# Patient Record
Sex: Female | Born: 1935 | Hispanic: Yes | Marital: Single | Smoking: Never smoker
Health system: Southern US, Community
[De-identification: ages and names within clinical notes are randomized; demographics above are authoritative.]

## PROBLEM LIST (undated history)

## (undated) DIAGNOSIS — K8050 Calculus of bile duct without cholangitis or cholecystitis without obstruction: Secondary | ICD-10-CM

## (undated) DIAGNOSIS — N189 Chronic kidney disease, unspecified: Secondary | ICD-10-CM

## (undated) DIAGNOSIS — I10 Essential (primary) hypertension: Secondary | ICD-10-CM

## (undated) DIAGNOSIS — K8051 Calculus of bile duct without cholangitis or cholecystitis with obstruction: Secondary | ICD-10-CM

## (undated) DIAGNOSIS — K449 Diaphragmatic hernia without obstruction or gangrene: Secondary | ICD-10-CM

## (undated) HISTORY — PX: APPENDECTOMY: SHX54

## (undated) HISTORY — DX: Diaphragmatic hernia without obstruction or gangrene: K44.9

## (undated) HISTORY — PX: KIDNEY STONE SURGERY: SHX686

## (undated) HISTORY — DX: Calculus of bile duct without cholangitis or cholecystitis without obstruction: K80.50

## (undated) HISTORY — PX: CATARACT EXTRACTION: SUR2

---

## 2011-01-09 ENCOUNTER — Emergency Department (HOSPITAL_COMMUNITY): Payer: Self-pay

## 2011-01-09 ENCOUNTER — Emergency Department (HOSPITAL_COMMUNITY)
Admission: EM | Admit: 2011-01-09 | Discharge: 2011-01-09 | Disposition: A | Discharge status: Home / Self Care | Payer: Self-pay | Attending: Emergency Medicine | Admitting: Emergency Medicine

## 2011-01-09 ENCOUNTER — Encounter (HOSPITAL_COMMUNITY): Payer: Self-pay | Admitting: Radiology

## 2011-01-09 DIAGNOSIS — E119 Type 2 diabetes mellitus without complications: Secondary | ICD-10-CM | POA: Insufficient documentation

## 2011-01-09 DIAGNOSIS — M545 Low back pain, unspecified: Secondary | ICD-10-CM | POA: Insufficient documentation

## 2011-01-09 DIAGNOSIS — D179 Benign lipomatous neoplasm, unspecified: Secondary | ICD-10-CM | POA: Insufficient documentation

## 2011-01-09 DIAGNOSIS — I10 Essential (primary) hypertension: Secondary | ICD-10-CM | POA: Insufficient documentation

## 2011-01-09 HISTORY — DX: Essential (primary) hypertension: I10

## 2011-01-09 LAB — URINALYSIS, ROUTINE W REFLEX MICROSCOPIC
Bilirubin Urine: NEGATIVE
Ketones, ur: NEGATIVE mg/dL
Nitrite: NEGATIVE
pH: 6.5 (ref 5.0–8.0)

## 2011-01-09 LAB — CBC
HCT: 38.2 % (ref 36.0–46.0)
MCHC: 34 g/dL (ref 30.0–36.0)
MCV: 85.7 fL (ref 78.0–100.0)
RDW: 13.4 % (ref 11.5–15.5)
WBC: 9 10*3/uL (ref 4.0–10.5)

## 2011-01-09 LAB — BASIC METABOLIC PANEL
BUN: 24 mg/dL — ABNORMAL HIGH (ref 6–23)
Chloride: 94 mEq/L — ABNORMAL LOW (ref 96–112)
Creatinine, Ser: 1.27 mg/dL — ABNORMAL HIGH (ref 0.50–1.10)
GFR calc Af Amer: 49 mL/min — ABNORMAL LOW (ref >60)

## 2011-01-09 LAB — DIFFERENTIAL
Eosinophils Relative: 0 % (ref 0–5)
Lymphocytes Relative: 16 % (ref 12–46)
Lymphs Abs: 1.4 10*3/uL (ref 0.7–4.0)
Monocytes Absolute: 0.4 10*3/uL (ref 0.1–1.0)
Monocytes Relative: 4 % (ref 3–12)

## 2011-01-09 LAB — URINE MICROSCOPIC-ADD ON

## 2011-06-01 ENCOUNTER — Other Ambulatory Visit: Payer: Self-pay

## 2011-06-01 ENCOUNTER — Encounter (HOSPITAL_COMMUNITY): Payer: Self-pay | Admitting: *Deleted

## 2011-06-01 ENCOUNTER — Emergency Department (HOSPITAL_COMMUNITY): Payer: Self-pay

## 2011-06-01 ENCOUNTER — Inpatient Hospital Stay (HOSPITAL_COMMUNITY)
Admission: EM | Admit: 2011-06-01 | Discharge: 2011-06-02 | DRG: 195 | Disposition: A | Discharge status: Home / Self Care | Payer: Self-pay | Attending: Family Medicine | Admitting: Family Medicine

## 2011-06-01 DIAGNOSIS — E119 Type 2 diabetes mellitus without complications: Secondary | ICD-10-CM

## 2011-06-01 DIAGNOSIS — M25519 Pain in unspecified shoulder: Secondary | ICD-10-CM | POA: Diagnosis present

## 2011-06-01 DIAGNOSIS — J111 Influenza due to unidentified influenza virus with other respiratory manifestations: Secondary | ICD-10-CM

## 2011-06-01 DIAGNOSIS — Z87442 Personal history of urinary calculi: Secondary | ICD-10-CM

## 2011-06-01 DIAGNOSIS — I10 Essential (primary) hypertension: Secondary | ICD-10-CM | POA: Diagnosis present

## 2011-06-01 DIAGNOSIS — Z23 Encounter for immunization: Secondary | ICD-10-CM

## 2011-06-01 DIAGNOSIS — B9789 Other viral agents as the cause of diseases classified elsewhere: Secondary | ICD-10-CM

## 2011-06-01 DIAGNOSIS — R509 Fever, unspecified: Secondary | ICD-10-CM | POA: Diagnosis present

## 2011-06-01 DIAGNOSIS — E860 Dehydration: Secondary | ICD-10-CM | POA: Diagnosis present

## 2011-06-01 DIAGNOSIS — Z79899 Other long term (current) drug therapy: Secondary | ICD-10-CM

## 2011-06-01 DIAGNOSIS — J09X2 Influenza due to identified novel influenza A virus with other respiratory manifestations: Principal | ICD-10-CM | POA: Diagnosis present

## 2011-06-01 LAB — CBC
HCT: 38.4 % (ref 36.0–46.0)
Hemoglobin: 12.6 g/dL (ref 12.0–15.0)
MCHC: 32.8 g/dL (ref 30.0–36.0)
RBC: 4.47 MIL/uL (ref 3.87–5.11)
WBC: 8.2 10*3/uL (ref 4.0–10.5)

## 2011-06-01 LAB — BASIC METABOLIC PANEL
BUN: 21 mg/dL (ref 6–23)
CO2: 24 mEq/L (ref 19–32)
Chloride: 96 mEq/L (ref 96–112)
Glucose, Bld: 235 mg/dL — ABNORMAL HIGH (ref 70–99)
Potassium: 3.6 mEq/L (ref 3.5–5.1)

## 2011-06-01 LAB — DIFFERENTIAL
Basophils Relative: 0 % (ref 0–1)
Eosinophils Relative: 0 % (ref 0–5)
Lymphocytes Relative: 7 % — ABNORMAL LOW (ref 12–46)
Monocytes Relative: 5 % (ref 3–12)
Neutro Abs: 7.2 10*3/uL (ref 1.7–7.7)

## 2011-06-01 LAB — URINALYSIS, ROUTINE W REFLEX MICROSCOPIC
Bilirubin Urine: NEGATIVE
Hgb urine dipstick: NEGATIVE
Nitrite: NEGATIVE
Specific Gravity, Urine: 1.016 (ref 1.005–1.030)
pH: 7 (ref 5.0–8.0)

## 2011-06-01 LAB — URINE MICROSCOPIC-ADD ON

## 2011-06-01 MED ORDER — ALBUTEROL SULFATE (5 MG/ML) 0.5% IN NEBU
5.0000 mg | INHALATION_SOLUTION | RESPIRATORY_TRACT | Status: AC
  Administered 2011-06-01: 5 mg via RESPIRATORY_TRACT
  Filled 2011-06-01: qty 1

## 2011-06-01 MED ORDER — SODIUM CHLORIDE 0.9 % IV SOLN
Freq: Once | INTRAVENOUS | Status: AC
  Administered 2011-06-01: 21:00:00 via INTRAVENOUS

## 2011-06-01 MED ORDER — SODIUM CHLORIDE 0.9 % IV BOLUS (SEPSIS)
500.0000 mL | Freq: Once | INTRAVENOUS | Status: AC
  Administered 2011-06-01: 1000 mL via INTRAVENOUS

## 2011-06-01 MED ORDER — CLONIDINE HCL 0.1 MG PO TABS
0.1000 mg | ORAL_TABLET | Freq: Once | ORAL | Status: AC
  Administered 2011-06-01: 0.1 mg via ORAL
  Filled 2011-06-01: qty 1

## 2011-06-01 MED ORDER — SODIUM CHLORIDE 0.9 % IV SOLN: INTRAVENOUS | Status: DC

## 2011-06-01 NOTE — ED Notes (Signed)
Pt. Unable to provide urine sample at this time. 

## 2011-06-01 NOTE — ED Provider Notes (Signed)
  Physical Exam  BP 180/101  Pulse 89  Temp(Src) 101.5 F (38.6 C) (Oral)  Resp 15  SpO2 98%  Physical Exam  ED Course  Procedures  MDM Fever and bodyaches. Patient does not speak [place]. She'll be moved to a regular exam room for more extensive workup.      Juliet Rude. Rubin Payor, MD 06/01/11 1712

## 2011-06-01 NOTE — ED Notes (Signed)
Pharmacy notified of need for med rec.  

## 2011-06-01 NOTE — ED Provider Notes (Signed)
History     CSN: [id number] Arrival date & time: 06/01/2011  2:51 PM   First MD Initiated Contact with Patient 06/01/11 1647      Chief Complaint  Patient presents with  . Shoulder Pain  . Generalized Body Aches  . Fever    (Consider location/radiation/quality/duration/timing/severity/associated sxs/prior treatment) HPI Comments: Patient reports she has had fever, sore throat, cough productive of white sputum and SOB x 5 days.  Has had associated decreased appetite.  Has been taking tylenol and motrin without improvement.    Patient is a 77 y.o. female presenting with fever. The history is provided by the patient and a relative. No language interpreter was used.  Fever Primary symptoms of the febrile illness include fever, cough and shortness of breath. Primary symptoms do not include abdominal pain, nausea, vomiting or diarrhea. The current episode started 3 to 5 days ago.  The cough began 3 to 5 days ago. The cough is productive. The sputum is white. Primary symptoms comment: +sore throat, rhinorrhea    Past Medical History  Diagnosis Date  . Hypertension   . Diabetes mellitus     Past Surgical History  Procedure Date  . Kidney stone surgery     No family history on file.  History  Substance Use Topics  . Smoking status: Never Smoker   . Smokeless tobacco: Not on file  . Alcohol Use: No    OB History    Grav Para Term Preterm Abortions TAB SAB Ect Mult Living                  Review of Systems  Constitutional: Positive for fever.  Respiratory: Positive for cough and shortness of breath.   Gastrointestinal: Positive for constipation. Negative for nausea, vomiting, abdominal pain and diarrhea.  All other systems reviewed and are negative.    Allergies  Review of patient's allergies indicates not on file.  Home Medications  No current outpatient prescriptions on file.  BP 217/90  Pulse 84  Temp(Src) 99.5 F (37.5 C) (Oral)  Resp 16  SpO2  100%  Physical Exam  Nursing note and vitals reviewed. Constitutional: She is oriented to person, place, and time. She appears well-developed and well-nourished.  HENT:  Head: Normocephalic and atraumatic.  Mouth/Throat: Uvula is midline. Mucous membranes are dry. No oropharyngeal exudate, posterior oropharyngeal edema or posterior oropharyngeal erythema.  Neck: Neck supple.  Cardiovascular: Normal rate, regular rhythm and normal heart sounds.   Pulmonary/Chest: Breath sounds normal. No respiratory distress. She has no wheezes. She has no rales. She exhibits no tenderness.       Shallow breathing + cough  Abdominal: Soft. Bowel sounds are normal. There is no tenderness.  Neurological: She is alert and oriented to person, place, and time.    ED Course  Procedures (including critical care time)  Labs Reviewed  DIFFERENTIAL - Abnormal; Notable for the following:    Neutrophils Relative 88 (*)    Lymphocytes Relative 7 (*)    Lymphs Abs 0.6 (*)    All other components within normal limits  BASIC METABOLIC PANEL - Abnormal; Notable for the following:    Sodium 134 (*)    Glucose, Bld 235 (*)    Creatinine, Ser 1.34 (*)    GFR calc non Af Amer 35 (*)    GFR calc Af Amer 41 (*)    All other components within normal limits  URINALYSIS, ROUTINE W REFLEX MICROSCOPIC - Abnormal; Notable for the following:  Glucose, UA 250 (*)    Ketones, ur 15 (*)    Protein, ur 30 (*)    Leukocytes, UA SMALL (*)    All other components within normal limits  CBC  URINE MICROSCOPIC-ADD ON  URINE CULTURE  POCT RAPID INFLUENZA A&B   Dg Chest 2 View  06/01/2011  *RADIOLOGY REPORT*  Clinical Data: 1-week history of cough and wheezing.  CHEST - 2 VIEW 06/01/2011:  Comparison: Two-view chest x-ray earlier today 1555 hours [facility].  Findings: Interval development of mild atelectasis in the left lower lobe, related to a less than optimal inspiration currently. Lungs otherwise clear.  Cardiac  silhouette enlarged but stable. Thoracic aorta tortuous atherosclerotic.  Hilar and mediastinal contours otherwise unremarkable.  No pleural effusions. Generalized osteopenia.  IMPRESSION: Suboptimal inspiration accounts for mild left lower lobe atelectasis.  No acute cardiopulmonary disease otherwise.  Original Report Authenticated By: Arnell Sieving, M.D.   Dg Chest 2 View  06/01/2011  *RADIOLOGY REPORT*  Clinical Data: Cough.  CHEST - 2 VIEW  Comparison: None.  Findings: Lungs are clear.  Heart size is normal.  No pneumothorax or pleural effusion.  IMPRESSION: No acute disease.  Original Report Authenticated By: Bernadene Bell. D'ALESSIO, M.D.   8:31 PM Patient seen and examined, discussed with Dr Weldon Inches.   11:27 PM Per family member, patient has been talking to her dead husband and talking to herself in the room, talking to various people who are not present.     Date: 06/02/2011  Rate: 81  Rhythm: normal sinus rhythm  QRS Axis: left  Intervals: normal  ST/T Wave abnormalities: t wave inversions  Conduction Disutrbances:none  Narrative Interpretation:   Old EKG Reviewed: none available    1. Dehydration   2. Viral respiratory illness       MDM  Frail elderly patient with viral respiratory illness with fever, decreased appetite and PO intake, hallucinating.  HTN improved with clonidine.  O2 saturation 94-96% on room air, not improved with nebulizer treatment.  PCP is [facility].  Admitted to [facility]          Rise Patience, PA 06/02/11 0024  Rise Patience, [place] 06/02/11 [phone]

## 2011-06-01 NOTE — ED Notes (Signed)
Patient reported to have body aches and fever for 5 days.

## 2011-06-01 NOTE — ED Notes (Signed)
Alert, interactive, calm, skin W&D, resps e/u, NAD, sitting in w/c, family at [facility], IVF infusing, moved to pod A 5.

## 2011-06-01 NOTE — ED Notes (Signed)
Pt information obtained via phone translator - pt reported to have productive cough, fever, lethargy, and "flu-like" symptoms. Pt noted to seem "dilerious" this a.m. - pt currently hypertensive w/ BP of 206/84 - pt's daughter states pt took her antihypertensive medication this a.m. Pt admits to chest pain - worse w/ cough. Denies n/v/d. Pt confused to current location and time - alert to person. EKG ordered per protocol.

## 2011-06-02 ENCOUNTER — Encounter (HOSPITAL_COMMUNITY): Payer: Self-pay | Admitting: Orthopedic Surgery

## 2011-06-02 LAB — CBC
HCT: 33.2 % — ABNORMAL LOW (ref 36.0–46.0)
MCH: 28.4 pg (ref 26.0–34.0)
MCH: 27.9 pg (ref 26.0–34.0)
MCHC: 32.6 g/dL (ref 30.0–36.0)
MCV: 85.6 fL (ref 78.0–100.0)
MCV: 85.5 fL (ref 78.0–100.0)
Platelets: 189 10*3/uL (ref 150–400)
Platelets: 183 10*3/uL (ref 150–400)
RBC: 3.88 MIL/uL (ref 3.87–5.11)
RDW: 14 % (ref 11.5–15.5)
WBC: 6.4 10*3/uL (ref 4.0–10.5)

## 2011-06-02 LAB — INFLUENZA PANEL BY PCR (TYPE A & B): Influenza A By PCR: POSITIVE — AB

## 2011-06-02 LAB — GLUCOSE, CAPILLARY: Glucose-Capillary: 158 mg/dL — ABNORMAL HIGH (ref 70–99)

## 2011-06-02 LAB — BASIC METABOLIC PANEL
BUN: 18 mg/dL (ref 6–23)
CO2: 24 mEq/L (ref 19–32)
Calcium: 8.6 mg/dL (ref 8.4–10.5)
Creatinine, Ser: 1.19 mg/dL — ABNORMAL HIGH (ref 0.50–1.10)
Glucose, Bld: 287 mg/dL — ABNORMAL HIGH (ref 70–99)

## 2011-06-02 MED ORDER — ALBUTEROL SULFATE (5 MG/ML) 0.5% IN NEBU: 2.5000 mg | INHALATION_SOLUTION | Freq: Four times a day (QID) | RESPIRATORY_TRACT | Status: DC | PRN

## 2011-06-02 MED ORDER — ACETAMINOPHEN 325 MG PO TABS
650.0000 mg | ORAL_TABLET | Freq: Once | ORAL | Status: AC
  Administered 2011-06-02: 650 mg via ORAL
  Filled 2011-06-02: qty 2

## 2011-06-02 MED ORDER — GLIMEPIRIDE 4 MG PO TABS
4.0000 mg | ORAL_TABLET | Freq: Every day | ORAL | Status: DC
Start: 2011-06-02 — End: 2011-06-02
  Administered 2011-06-02: 4 mg via ORAL
  Filled 2011-06-02 (×2): qty 1

## 2011-06-02 MED ORDER — SODIUM CHLORIDE 0.9 % IV SOLN
INTRAVENOUS | Status: DC
  Administered 2011-06-02: 06:00:00 via INTRAVENOUS

## 2011-06-02 MED ORDER — HYDROCHLOROTHIAZIDE 25 MG PO TABS
25.0000 mg | ORAL_TABLET | Freq: Every day | ORAL | Status: DC
  Administered 2011-06-02: 25 mg via ORAL
  Filled 2011-06-02: qty 1

## 2011-06-02 MED ORDER — METFORMIN HCL 500 MG PO TABS
1000.0000 mg | ORAL_TABLET | Freq: Two times a day (BID) | ORAL | Status: DC
  Administered 2011-06-02: 1000 mg via ORAL
  Filled 2011-06-02 (×3): qty 2

## 2011-06-02 MED ORDER — INSULIN ASPART 100 UNIT/ML ~~LOC~~ SOLN
0.0000 [IU] | Freq: Three times a day (TID) | SUBCUTANEOUS | Status: DC
  Administered 2011-06-02: 5 [IU] via SUBCUTANEOUS
  Administered 2011-06-02: 2 [IU] via SUBCUTANEOUS
  Filled 2011-06-02: qty 3

## 2011-06-02 MED ORDER — OSELTAMIVIR PHOSPHATE 75 MG PO CAPS: 75.0000 mg | ORAL_CAPSULE | Freq: Two times a day (BID) | ORAL | Status: AC

## 2011-06-02 MED ORDER — LISINOPRIL-HYDROCHLOROTHIAZIDE 20-25 MG PO TABS: 1.0000 | ORAL_TABLET | Freq: Every day | ORAL | Status: DC

## 2011-06-02 MED ORDER — ACETAMINOPHEN 325 MG PO TABS: 650.0000 mg | ORAL_TABLET | Freq: Four times a day (QID) | ORAL | Status: DC | PRN

## 2011-06-02 MED ORDER — LISINOPRIL 20 MG PO TABS
20.0000 mg | ORAL_TABLET | Freq: Every day | ORAL | Status: DC
  Administered 2011-06-02: 20 mg via ORAL
  Filled 2011-06-02: qty 1

## 2011-06-02 MED ORDER — DOCUSATE SODIUM 100 MG PO CAPS
100.0000 mg | ORAL_CAPSULE | Freq: Two times a day (BID) | ORAL | Status: DC
  Administered 2011-06-02: 100 mg via ORAL
  Filled 2011-06-02 (×2): qty 1

## 2011-06-02 MED ORDER — HEPARIN SODIUM (PORCINE) 5000 UNIT/ML IJ SOLN
5000.0000 [IU] | Freq: Three times a day (TID) | INTRAMUSCULAR | Status: DC
  Administered 2011-06-02: 5000 [IU] via SUBCUTANEOUS
  Filled 2011-06-02 (×4): qty 1

## 2011-06-02 MED ORDER — PNEUMOCOCCAL VAC POLYVALENT 25 MCG/0.5ML IJ INJ: 0.5000 mL | INJECTION | INTRAMUSCULAR | Status: DC

## 2011-06-02 MED ORDER — OSELTAMIVIR PHOSPHATE 75 MG PO CAPS
75.0000 mg | ORAL_CAPSULE | Freq: Two times a day (BID) | ORAL | Status: DC
  Administered 2011-06-02: 75 mg via ORAL
  Filled 2011-06-02 (×3): qty 1

## 2011-06-02 MED ORDER — ACETAMINOPHEN 650 MG RE SUPP: 650.0000 mg | Freq: Four times a day (QID) | RECTAL | Status: DC | PRN

## 2011-06-02 NOTE — H&P (Signed)
Family Medicine Teaching Service Attending Note  I interviewed and examined patient Tammy Willis and reviewed their tests and x-rays.  I discussed with Dr. Hulen Luster and reviewed their note for today.  I agree with their assessment and plan.     Additionally  This am is resting easily.  No pain or shortness of breath Did not eat much breakfast - apparently did not like it Lungs - basilar crackles no respiratory distress No edema Seems to be improving.  Discuss with family, assess ambulation and ability to do ADLs and take oral to determine if ready for discharge.  Would complete tamiflu course if family can afford.   Likely of low benefit but also low risk

## 2011-06-02 NOTE — ED Provider Notes (Signed)
Medical screening examination/treatment/procedure(s) were performed by non-physician practitioner and as supervising physician I was immediately available for consultation/collaboration.  Sakai Wolford P Rayne Loiseau, MD 06/02/11 1510 

## 2011-06-02 NOTE — H&P (Signed)
Family Medicine Teaching [facility] Admission History and Physical  Patient name: Tammy Willis Medical record number: [id number] Date of birth: [birth year] Age: 77 y.o. Gender: female  Primary Care Provider: [facility] Chief Complaint: Cough and fever  History of Present Illness: Tammy Willis is a 77 y.o. year old female presenting with 5 days of cough and fever. She has multiple sick contacts with flulike illness. Since Monday she has been coughing and has been running a tactile temperature. No one has checked her temperature of thermometer. She lives with her daughter who is taking care of her. Another daughter is here today to help with the history. History is obtained with a translator phone throughout the the entire interview. Daughter states that she's been doing fine at home with her flulike symptoms until today. Today she felt she was getting increasingly feverish, had increasing cough, and was complaining of chest pain when she coughed. Daughter also states that she was talking to people who weren't in the room. Yesterday and today she has not been eating well and she is not drinking hardly at all.  Patient is an immigrant  from [place]. She does not have Medicaid or financial aide set up. Her daughter spoke with me for a long time about her financial concerns. They're concerned because they have an old bill from [employer] that they are getting calls about from people who do not speak Spanish.  Review Of Systems: Per HPI with the following additions: Patient denies any pain at this time. Otherwise 12 point review of systems was performed and was unremarkable.  There is no problem list on file for this patient.   Past Medical History: Past Medical History  Diagnosis Date  . Hypertension   . Diabetes mellitus     Past Surgical History: Past Surgical History  Procedure Date  . Kidney stone surgery     Social History: History   Social History    . Marital Status: Single    Spouse Name: N/A    Number of Children: N/A  . Years of Education: N/A   Social History Main Topics  . Smoking status: Never Smoker   . Smokeless tobacco: None  . Alcohol Use: No  . Drug Use: No  . Sexually Active:    Other Topics Concern  . None   Social History Narrative  . None    Family History: All of her children have diabetes. Allergies: No Known Allergies  Current Facility-Administered Medications  Medication Dose Route Frequency Provider Last Rate Last Dose  . 0.9 %  sodium chloride infusion   Intravenous Once Rise Patience, [place] 150 mL/hr at 06/01/11 2036    . 0.9 %  sodium chloride infusion   Intravenous Continuous Ellery Plunk      . acetaminophen (TYLENOL) tablet 650 mg  650 mg Oral Q6H PRN Ellery Plunk       Or  . acetaminophen (TYLENOL) suppository 650 mg  650 mg Rectal Q6H PRN Ellery Plunk      . acetaminophen (TYLENOL) tablet 650 mg  650 mg Oral Once Rise Patience, PA   650 mg at 06/02/11 0047  . albuterol (PROVENTIL) (5 MG/ML) 0.5% nebulizer solution 5 mg  5 mg Nebulization NOW Rise Patience, PA   5 mg at 06/01/11 2118  . cloNIDine (CATAPRES) tablet 0.1 mg  0.1 mg Oral Once Rise Patience, PA   0.1 mg at 06/01/11 2116  . docusate sodium (COLACE) capsule 100 mg  100  mg Oral BID Ellery Plunk      . glimepiride (AMARYL) tablet 4 mg  4 mg Oral QAC breakfast Ellery Plunk      . heparin injection 5,000 Units  5,000 Units Subcutaneous Q8H Ellery Plunk      . insulin aspart (novoLOG) injection 0-9 Units  0-9 Units Subcutaneous TID WC Ellery Plunk      . lisinopril-hydrochlorothiazide (PRINZIDE,ZESTORETIC) 20-25 MG per tablet 1 tablet  1 tablet Oral Daily Ellery Plunk      . metFORMIN (GLUCOPHAGE) tablet 1,000 mg  1,000 mg Oral BID WC Ellery Plunk      . sodium chloride 0.9 % bolus 500 mL  500 mL Intravenous Once Harrold Donath R. Pickering, MD   1,000 mL at 06/01/11 1827  . DISCONTD: 0.9 %  sodium chloride infusion   Intravenous  Continuous Juliet Rude. Pickering, MD   1,000 mL at 06/01/11 1828   Current Outpatient Prescriptions  Medication Sig Dispense Refill  . glimepiride (AMARYL) 4 MG tablet Take 4 mg by mouth daily before breakfast.        . lisinopril-hydrochlorothiazide (PRINZIDE,ZESTORETIC) 20-25 MG per tablet Take 1 tablet by mouth daily.        . metFORMIN (GLUCOPHAGE) 1000 MG tablet Take 1,000 mg by mouth 2 (two) times daily with a meal.        . multivitamin-iron-minerals-folic acid (CENTRUM) chewable tablet Chew 1 tablet by mouth daily.           Physical Exam: BP 137/59  Pulse 82  Temp(Src) 101.4 F (38.6 C) (Oral)  Resp 26  SpO2 95%            General: cooperative, fatigued and no distress HEENT: PERRLA, extra ocular movement intact and trachea midline Heart: S1, S2 normal, no murmur, rub or gallop, regular rate and rhythm Lungs: no wheezes or rales and unlabored breathing Abdomen: abdomen is soft without significant tenderness, masses, organomegaly or guarding Extremities: extremities normal, atraumatic, no cyanosis or edema Musculoskeletal: not examined, patient did not want to be examined because it was late and she wanted to go back to sleep. Skin:no rashes, thin and dry Neurology: Patient was easily awoken for exam. However it was late and she did not want to be examined or participate in the exam. Per her daughter her speech is normal. She was moving all 4 extremities. Her cranial nerves were grossly intact.  Labs and Imaging: Lab Results  Component Value Date/Time   NA 134* 06/01/2011  5:14 PM   K 3.6 06/01/2011  5:14 PM   CL 96 06/01/2011  5:14 PM   CO2 24 06/01/2011  5:14 PM   BUN 21 06/01/2011  5:14 PM   CREATININE 1.34* 06/01/2011  5:14 PM   GLUCOSE 235* 06/01/2011  5:14 PM   Lab Results  Component Value Date   WBC 8.2 06/01/2011   HGB 12.6 06/01/2011   HCT 38.4 06/01/2011   MCV 85.9 06/01/2011   PLT 221 06/01/2011   Dg Chest 2 View  06/01/2011  *RADIOLOGY REPORT*  Clinical Data:  1-week history of cough and wheezing.  CHEST - 2 VIEW 06/01/2011:  Comparison: Two-view chest x-ray earlier today 1555 hours [facility].  Findings: Interval development of mild atelectasis in the left lower lobe, related to a less than optimal inspiration currently. Lungs otherwise clear.  Cardiac silhouette enlarged but stable. Thoracic aorta tortuous atherosclerotic.  Hilar and mediastinal contours otherwise unremarkable.  No pleural effusions. Generalized osteopenia.  IMPRESSION: Suboptimal inspiration accounts for  mild left lower lobe atelectasis.  No acute cardiopulmonary disease otherwise.  Original Report Authenticated By: Arnell Sieving, M.D.   Dg Chest 2 View  06/01/2011  *RADIOLOGY REPORT*  Clinical Data: Cough.  CHEST - 2 VIEW  Comparison: None.  Findings: Lungs are clear.  Heart size is normal.  No pneumothorax or pleural effusion.  IMPRESSION: No acute disease.  Original Report Authenticated By: Bernadene Bell. Maricela Curet, M.D.        Assessment and Plan: Noemy Hallmon is a 77 y.o. year old female presenting with flulike illness.  1. flulike illness-patient has a flulike illness. She's been sick for 5 days but her course is worsening instead of improving. Because she is in a high-risk group due to her age and because her illness is worsening, I will start Tamiflu in her. I think that her brief hallucinations were likely due to fever, and they are not present now. I plan to admit her and observe her overnight. Her chest x-ray is reassuring that she does not have a pneumonia. Her vitals are stable, and she seems to be maintaining her saturation. She does not have pain anywhere else or any other rashes or lesions to indicate another source of fever. Her urine does not strongly suggest infection either. Since she has not been drinking well, we will hydrate her with fluids. 2. Diabetes-we will restart her home medications. Her sugars seem to be a little bit high. I will start her  on a sliding scale sensitive to cover her while she is in the hospital. 3. Hypertension-we will restart patient's home medications. She is likely not have taken them over last several days because she has been sick. 4. Financial concerns-patient's daughter was very concerned about finances. I will place a care manager consult to have them discuss possibilities. 5. CODE STATUS-discussed patient's CODE STATUS with daughter. Use translator phone for the entire discussion. Daughter would like everything possible done. 6. FEN GI-will start 100 mL per hour of normal saline. Will place on carb modified diet. Patient with complaint of constipation, so will start Colace. 7. Prophylaxis: Heparin 5000 subcutaneous 3 times a day 8. Disposition: Likely short stay, pending improvement in condition.   SignedEllery Plunk, MD Family Medicine Resident PGY-3 06/02/2011 12:50 AM  [phone]

## 2011-06-03 LAB — URINE CULTURE: Culture  Setup Time: 201212072202

## 2011-06-04 NOTE — Discharge Summary (Signed)
Physician Discharge Summary  Patient ID: Tammy Willis MRN  [id number] DOB  December 31, 1924    Age: 77 y.o.  Admit date: 06/01/2011 Discharge date: 06/04/2011  PCP: No primary provider on file.   Discharge Diagnosis  1. Influenza A 2. DM II 3. HTN   Discharge Medications  Tammy Willis, Tammy Willis  Home Medication Instructions WUJ:[id number]   Printed on:06/04/11 1833  Medication Information                    metFORMIN (GLUCOPHAGE) 1000 MG tablet Take 1,000 mg by mouth 2 (two) times daily with a meal.             glimepiride (AMARYL) 4 MG tablet Take 4 mg by mouth daily before breakfast.             lisinopril-hydrochlorothiazide (PRINZIDE,ZESTORETIC) 20-25 MG per tablet Take 1 tablet by mouth daily.             Multiple Vitamins-Minerals (MULTIVITAMINS THER. W/MINERALS) TABS Take 1 tablet by mouth daily. Centrum silver            oseltamivir (TAMIFLU) 75 MG capsule Take 1 capsule (75 mg total) by mouth every 12 (twelve) hours.              Consults: None.  Labs: CBC  Lab 06/02/11 0500 06/02/11 0053 06/01/11 1714  WBC 6.4 7.5 8.2  HGB 10.4* 11.0* 12.6  HCT 31.9* 33.2* 38.4  PLT 183 189 221   BMET  Lab 06/02/11 0500 06/01/11 1714  NA 135 134*  K 3.5 3.6  CL 101 96  CO2 24 24  BUN 18 21  CREATININE 1.19* 1.34*  CALCIUM 8.6 9.5  PROT -- --  BILITOT -- --  ALKPHOS -- --  ALT -- --  AST -- --  GLUCOSE 287* 235*   Results for orders placed during the hospital encounter of 06/01/11 (from the past 72 hour(s))  URINALYSIS, ROUTINE W REFLEX MICROSCOPIC     Status: Abnormal   Collection Time   06/01/11  9:17 PM      Component Value Range Comment   Color, Urine YELLOW  YELLOW     APPearance CLEAR  CLEAR     Specific Gravity, Urine 1.016  1.005 - 1.030     pH 7.0  5.0 - 8.0     Glucose, UA 250 (*) NEGATIVE (mg/dL)    Hgb urine dipstick NEGATIVE  NEGATIVE     Bilirubin Urine NEGATIVE  NEGATIVE     Ketones, ur 15 (*) NEGATIVE (mg/dL)    Protein, ur  30 (*) NEGATIVE (mg/dL)    Urobilinogen, UA 1.0  0.0 - 1.0 (mg/dL)    Nitrite NEGATIVE  NEGATIVE     Leukocytes, UA SMALL (*) NEGATIVE    URINE CULTURE     Status: Normal   Collection Time   06/01/11  9:17 PM      Component Value Range Comment   Specimen Description URINE, CLEAN CATCH      Special Requests NONE      Setup Time [id number]      Colony Count 7,000 COLONIES/ML      Culture INSIGNIFICANT GROWTH      Report Status 06/03/2011 FINAL     URINE MICROSCOPIC-ADD ON     Status: Normal   Collection Time   06/01/11  9:17 PM      Component Value Range Comment   Squamous Epithelial / LPF RARE  RARE     WBC, UA 3-6  <  3 (WBC/hpf)    RBC / HPF 0-2  <3 (RBC/hpf)    Bacteria, UA RARE  RARE    INFLUENZA PANEL BY PCR     Status: Abnormal   Collection Time   06/02/11 12:43 AM      Component Value Range Comment   Influenza A By PCR POSITIVE (*) NEGATIVE     Influenza B By PCR NEGATIVE  NEGATIVE     H1N1 flu by pcr NOT DETECTED  NOT DETECTED      Procedures/Imaging:  Dg Chest 2 View 06/01/2011  IMPRESSION: Suboptimal inspiration accounts for mild left lower lobe atelectasis.  No acute cardiopulmonary disease otherwise.  Original Report Authenticated By: Arnell Sieving, M.D.   Dg Chest 2 View 06/01/2011 IMPRESSION: No acute disease.  Original Report Authenticated By: Bernadene Bell. D'ALESSIO, M.D.     Brief Hospital Course: Tammy Willis is a 77 y.o. year old female presenting with flulike illness.  1. Influenza A.  High-risk group due to her age and  worsening condition was the reason for starting her on Tamiflu. Had an episode of brief hallucinations were likely due to fever, and they did not repeat. Chest x-ray negative for pneumonia. Her vitals were stable during hospitalization. Was not having good intake for 5 days prior this admission so Hydration with NS was provided and pt responded well. 2. Diabetes: On home medication during hospitalization. Stable glucose on the mid  200's. No hypoglycemic episodes. 3. Hypertension:  Mostly systolic hypertension. We were not to aggressive with her BP since Pt did not have symptoms of end organ damage.We restarted her home meds since pt had 5 days without taking her treatment.   Patient condition at time of discharge/disposition:  Patient is discharge home on stable medical condition.   Follow up issues: 1. Respiratory condition. 2. DM. Glucose mid 200's consider A1C, and adjusting treatment if necessary.  3. HTN.   Discharge follow up:   Discharge Orders    Future Orders Please Complete By Expires   Diet Carb Modified      Increase activity slowly         D. [place] Rolene Arbour, MD  [facility] 06/04/2011

## 2011-06-05 NOTE — Discharge Summary (Signed)
I have reviewed this discharge summary and agree.    

## 2011-08-25 ENCOUNTER — Other Ambulatory Visit: Payer: Self-pay

## 2011-08-25 ENCOUNTER — Encounter (HOSPITAL_COMMUNITY): Payer: Self-pay | Admitting: Emergency Medicine

## 2011-08-25 ENCOUNTER — Emergency Department (HOSPITAL_COMMUNITY)
Admission: EM | Admit: 2011-08-25 | Discharge: 2011-08-26 | Disposition: A | Discharge status: Home / Self Care | Payer: Self-pay | Attending: Emergency Medicine | Admitting: Emergency Medicine

## 2011-08-25 ENCOUNTER — Ambulatory Visit (INDEPENDENT_AMBULATORY_CARE_PROVIDER_SITE_OTHER): Payer: Self-pay | Admitting: Family Medicine

## 2011-08-25 DIAGNOSIS — R55 Syncope and collapse: Secondary | ICD-10-CM

## 2011-08-25 DIAGNOSIS — R112 Nausea with vomiting, unspecified: Secondary | ICD-10-CM | POA: Insufficient documentation

## 2011-08-25 DIAGNOSIS — E1165 Type 2 diabetes mellitus with hyperglycemia: Secondary | ICD-10-CM | POA: Insufficient documentation

## 2011-08-25 DIAGNOSIS — R9431 Abnormal electrocardiogram [ECG] [EKG]: Secondary | ICD-10-CM

## 2011-08-25 DIAGNOSIS — I10 Essential (primary) hypertension: Secondary | ICD-10-CM | POA: Insufficient documentation

## 2011-08-25 DIAGNOSIS — E119 Type 2 diabetes mellitus without complications: Secondary | ICD-10-CM | POA: Insufficient documentation

## 2011-08-25 DIAGNOSIS — Z79899 Other long term (current) drug therapy: Secondary | ICD-10-CM | POA: Insufficient documentation

## 2011-08-25 DIAGNOSIS — R5381 Other malaise: Secondary | ICD-10-CM | POA: Insufficient documentation

## 2011-08-25 DIAGNOSIS — R1110 Vomiting, unspecified: Secondary | ICD-10-CM

## 2011-08-25 LAB — CBC
HCT: 41 % (ref 36.0–46.0)
Hemoglobin: 13.8 g/dL (ref 12.0–15.0)
MCH: 28.6 pg (ref 26.0–34.0)
MCHC: 33.7 g/dL (ref 30.0–36.0)
MCV: 84.9 fL (ref 78.0–100.0)
Platelets: 214 10*3/uL (ref 150–400)
RBC: 4.83 MIL/uL (ref 3.87–5.11)
RDW: 14.3 % (ref 11.5–15.5)
WBC: 10.3 10*3/uL (ref 4.0–10.5)

## 2011-08-25 LAB — URINALYSIS, ROUTINE W REFLEX MICROSCOPIC
Glucose, UA: 100 mg/dL — AB
Hgb urine dipstick: NEGATIVE
Ketones, ur: 15 mg/dL — AB
Nitrite: NEGATIVE
Protein, ur: 100 mg/dL — AB
Specific Gravity, Urine: 1.019 (ref 1.005–1.030)
Urobilinogen, UA: 1 mg/dL (ref 0.0–1.0)
pH: 6.5 (ref 5.0–8.0)

## 2011-08-25 LAB — URINE MICROSCOPIC-ADD ON

## 2011-08-25 LAB — COMPREHENSIVE METABOLIC PANEL
ALT: 6 U/L (ref 0–35)
AST: 16 U/L (ref 0–37)
Albumin: 3.6 g/dL (ref 3.5–5.2)
Alkaline Phosphatase: 106 U/L (ref 39–117)
BUN: 20 mg/dL (ref 6–23)
CO2: 25 mEq/L (ref 19–32)
Calcium: 10.2 mg/dL (ref 8.4–10.5)
Chloride: 102 mEq/L (ref 96–112)
Creatinine, Ser: 1.06 mg/dL (ref 0.50–1.10)
GFR calc Af Amer: 54 mL/min — ABNORMAL LOW (ref >90)
GFR calc non Af Amer: 47 mL/min — ABNORMAL LOW (ref >90)
Glucose, Bld: 207 mg/dL — ABNORMAL HIGH (ref 70–99)
Potassium: 3.5 mEq/L (ref 3.5–5.1)
Sodium: 139 mEq/L (ref 135–145)
Total Bilirubin: 0.6 mg/dL (ref 0.3–1.2)
Total Protein: 7.6 g/dL (ref 6.0–8.3)

## 2011-08-25 LAB — DIFFERENTIAL
Basophils Absolute: 0 10*3/uL (ref 0.0–0.1)
Basophils Relative: 0 % (ref 0–1)
Eosinophils Absolute: 0 10*3/uL (ref 0.0–0.7)
Eosinophils Relative: 0 % (ref 0–5)
Lymphocytes Relative: 4 % — ABNORMAL LOW (ref 12–46)
Lymphs Abs: 0.5 10*3/uL — ABNORMAL LOW (ref 0.7–4.0)
Monocytes Absolute: 0.3 10*3/uL (ref 0.1–1.0)
Monocytes Relative: 2 % — ABNORMAL LOW (ref 3–12)
Neutro Abs: 9.6 10*3/uL — ABNORMAL HIGH (ref 1.7–7.7)
Neutrophils Relative %: 93 % — ABNORMAL HIGH (ref 43–77)

## 2011-08-25 LAB — TROPONIN I: Troponin I: <0.3 ng/mL (ref <0.30)

## 2011-08-25 MED ORDER — ONDANSETRON HCL 4 MG/2ML IJ SOLN
4.0000 mg | Freq: Once | INTRAMUSCULAR | Status: AC
  Administered 2011-08-25: 4 mg via INTRAVENOUS
  Filled 2011-08-25: qty 2

## 2011-08-25 NOTE — ED Provider Notes (Signed)
History     CSN: [id number]  Arrival date & time 08/25/11  1752   First MD Initiated Contact with Patient 08/25/11 1910      Chief Complaint  Patient presents with  . Nausea  . Emesis  . Weakness    (Consider location/radiation/quality/duration/timing/severity/associated sxs/prior treatment) The history is provided by the patient. The history is limited by a language barrier. A language interpreter was used.    patient is emergency department following the onset of nausea and vomiting at around 11:30 this morning.  The patient states that she does not have any abdominal pain, chest pain, shortness of breath, weakness, headache, visual changes, fever, or diarrhea.  Patient also denies any blood in the vomit or stool.  Patient originally went to urgent care for treatment and they sent her to the emergency department for further evaluation. Past Medical History  Diagnosis Date  . Hypertension   . Diabetes mellitus     Past Surgical History  Procedure Date  . Kidney stone surgery     No family history on file.  History  Substance Use Topics  . Smoking status: Never Smoker   . Smokeless tobacco: Not on file  . Alcohol Use: No    OB History    Grav Para Term Preterm Abortions TAB SAB Ect Mult Living                  Review of Systems All pertinent positives and negatives reviewed in the history of present illness\  Allergies  Review of patient's allergies indicates no known allergies.  Home Medications   Current Outpatient Rx  Name Route Sig Dispense Refill  . GLIMEPIRIDE 4 MG PO TABS Oral Take 4 mg by mouth daily before breakfast.      . LISINOPRIL-HYDROCHLOROTHIAZIDE 20-25 MG PO TABS Oral Take 1 tablet by mouth daily.      Marland Kitchen METFORMIN HCL 1000 MG PO TABS Oral Take 1,000 mg by mouth 2 (two) times daily with a meal.      . THERA M PLUS PO TABS Oral Take 1 tablet by mouth daily. Centrum silver       BP 195/90  Pulse 88  Temp(Src) 97.1 F (36.2 C) (Oral)  Resp  18  SpO2 100%  Physical Exam  Nursing note and vitals reviewed. Constitutional: She is oriented to person, place, and time. She appears well-developed and well-nourished. No distress.  HENT:  Head: Normocephalic and atraumatic.  Eyes: Pupils are equal, round, and reactive to light.  Neck: Normal range of motion. Neck supple.  Cardiovascular: Normal rate, regular rhythm and normal heart sounds.  Exam reveals no gallop and no friction rub.   No murmur heard. Pulmonary/Chest: Effort normal and breath sounds normal. No respiratory distress. She has no wheezes. She has no rales.  Abdominal: Soft. Bowel sounds are normal. She exhibits no distension. There is no tenderness. There is no rebound and no guarding.  Neurological: She is alert and oriented to person, place, and time.  Skin: Skin is warm and dry. No rash noted. She is not diaphoretic.    ED Course  Procedures (including critical care time)  Labs Reviewed  DIFFERENTIAL - Abnormal; Notable for the following:    Neutrophils Relative 93 (*)    Neutro Abs 9.6 (*)    Lymphocytes Relative 4 (*)    Lymphs Abs 0.5 (*)    Monocytes Relative 2 (*)    All other components within normal limits  COMPREHENSIVE METABOLIC PANEL - Abnormal;  Notable for the following:    Glucose, Bld 207 (*)    GFR calc non Af Amer 47 (*)    GFR calc Af Amer 54 (*)    All other components within normal limits  URINALYSIS, ROUTINE W REFLEX MICROSCOPIC - Abnormal; Notable for the following:    Glucose, UA 100 (*)    Bilirubin Urine SMALL (*)    Ketones, ur 15 (*)    Protein, ur 100 (*)    Leukocytes, UA TRACE (*)    All other components within normal limits  URINE MICROSCOPIC-ADD ON - Abnormal; Notable for the following:    Squamous Epithelial / LPF FEW (*)    Bacteria, UA FEW (*)    Crystals CA OXALATE CRYSTALS (*)    All other components within normal limits  CBC  TROPONIN I      The patient has had no vomiting further here in the emergency  department has been monitored for several hours.  She has been able to drink fluids without further vomiting.  She has received IV fluids as well.  This appears to be a GI illness vs. other etiologies based on her history of present illness and physical exam.  Patient has denied any chest pain shortness of breath or other symptoms other than the nausea and vomiting.  In her age range cardiac issues were considered but due to the time that had been since the onset of symptoms 1 troponin was ordered and was negative.  The patient had an abnormal EKG but appeared minimally changed for the most part based on her prior EKG.  Dr. Juleen China assisted in the care of this patient and followed along the entire visit with me.  The patient is advised to return here for any worsening in her condition.  The family is also given the plan and voiced an understanding.    MDM  MDM Reviewed: nursing note and vitals Interpretation: labs, ECG and x-ray      Date: 08/26/2011 1755  Rate:85  Rhythm: normal sinus rhythm  QRS Axis: normal  Intervals: normal  ST/T Wave abnormalities: nonspecific T wave changes  Conduction Disutrbances:left anterior fascicular block  Narrative Interpretation:   Old EKG Reviewed: unchanged    Date: 08/26/2011 20:04  Rate: 85  Rhythm: normal sinus rhythm  QRS Axis: normal  Intervals: normal  ST/T Wave abnormalities: nonspecific T wave changes  Conduction Disutrbances:left anterior fascicular block  Narrative Interpretation:   Old EKG Reviewed: unchanged        Carlyle Dolly, PA-C 08/26/11 0028

## 2011-08-25 NOTE — Progress Notes (Signed)
  Subjective:    Patient ID: Tammy Willis, female    DOB: July 03, 1924, 78 y.o.   MRN: [id number]  HPI Tammy Willis is a 78 y.o. female Pulled to waiting room emergently as patient collapsed in bathroom. # MD's responded with clinic staff, patient responsive to voice - lying on floor.  Placed in wheelchair. Brought to room 7. Language barrier - spanish spoken and family member translating.  Vomiting since 2am. Denies CP abdominal pain or SOB.  She has a history of DM - unknown control,  and HTN, but no personal or FH of CAD/MI or CVD.  Not on aspirin, has had gastritis in past, with bleeding by family members report, but when had eval at hospital, no apparent ulcer.   Review of Systems  Constitutional: Negative for fever and chills.  Respiratory: Negative for shortness of breath.   Cardiovascular: Negative for chest pain.  Gastrointestinal: Positive for nausea and vomiting. Negative for abdominal pain.  Neurological: Positive for syncope and weakness. Negative for speech difficulty.       Generalized weakness.   Other as per HPI.    Objective:   Physical Exam Performed by Dr. Cleta Alberts.  See his addendum/note.      EKG - sinus rhythm,  Rate 95.  inferolateral st depression, no prior EKG available to review. Assessment & Plan:  Tammy Willis is a 78 y.o. female 1. Diabetes mellitus  POCT glucose (manual entry)  2. Vomiting    3. Syncope    4. Abnormal EKG    Vomiting illness since 2 am, with syncopal episode in office - volume depletion vs anginal equivalent with underlying DM and HTN. IV placed, EMS contacted and patient transported by EMS to [facility] for eval.  See scanned copy of emergency flowsheet.

## 2011-08-25 NOTE — ED Notes (Signed)
Per EMS, pt to ED from Urgent care.  Pt to Urgent care with c/o nausea and vomiting since this AM.  While being evaluated, pt with abnormal EKG.  Pt sent to ED for further evaluation.

## 2011-08-25 NOTE — Progress Notes (Signed)
  Subjective:    Patient ID: Tammy Willis, female    DOB: 01-03-1925, 78 y.o.   MRN: [id number]  HPI    Review of Systems     Objective:   Physical Exam initial physical exam when first seen patient was semiconscious lying in the bathroom. We did get her and brought her back to exam room 6. Initially she was able to respond to verbal and painful stimuli. She quickly awakened and complained of severe nausea. Patient was semi-alert though assessment was difficult because of language barrier. The neck was supple her chest was clear to auscultation and percussion. Her heart was regular rate without murmurs rubs or gallops. Timentin was flat not distended there was tenderness in the right upper and midepigastric area. Extremities were without edema.       Assessment & Plan:

## 2011-08-25 NOTE — ED Provider Notes (Signed)
Medical screening examination/treatment/procedure(s) were conducted as a shared visit with non-physician practitioner(s) and myself.  I personally evaluated the patient during the encounter.  78 year old female presenting for evaluation of nausea and vomiting since this morning.  Workup today pretty unremarkable. My abdominal examination was benign. Very low clinical suspicion for surgical abdomen. Suspect likely viral illness. Imaging was considered but deferred. Patient does have an abnormal EKG but this is not significantly changed in comparison to prior. Consider atypical ACS but doubt. Patient reports improved symptoms. Return precautions were discussed. Symptomatic treatment. Outpatient followup.  Raeford Razor, MD 08/25/11 2351

## 2011-08-26 MED ORDER — ONDANSETRON HCL 4 MG PO TABS: 4.0000 mg | ORAL_TABLET | Freq: Four times a day (QID) | ORAL | Status: AC

## 2011-08-26 NOTE — Discharge Instructions (Signed)
Return here for any worsening in your condition.  Slowly increase her fluids.  Followup with your primary care Dr. for recheck.

## 2011-09-03 NOTE — ED Provider Notes (Signed)
Medical screening examination/treatment/procedure(s) were conducted as a shared visit with non-physician practitioner(s) and myself.  I personally evaluated the patient during the encounter  Raeford Razor, MD 09/03/11 [phone]

## 2011-11-12 ENCOUNTER — Encounter: Payer: Self-pay | Admitting: Family Medicine

## 2011-12-10 ENCOUNTER — Encounter (HOSPITAL_COMMUNITY): Payer: Self-pay | Admitting: *Deleted

## 2011-12-10 ENCOUNTER — Emergency Department (HOSPITAL_COMMUNITY)
Admission: EM | Admit: 2011-12-10 | Discharge: 2011-12-10 | Disposition: A | Discharge status: Home / Self Care | Payer: Self-pay | Attending: Emergency Medicine | Admitting: Emergency Medicine

## 2011-12-10 ENCOUNTER — Emergency Department (HOSPITAL_COMMUNITY): Payer: Self-pay

## 2011-12-10 DIAGNOSIS — S22000A Wedge compression fracture of unspecified thoracic vertebra, initial encounter for closed fracture: Secondary | ICD-10-CM

## 2011-12-10 DIAGNOSIS — S22009A Unspecified fracture of unspecified thoracic vertebra, initial encounter for closed fracture: Secondary | ICD-10-CM | POA: Insufficient documentation

## 2011-12-10 DIAGNOSIS — Y998 Other external cause status: Secondary | ICD-10-CM | POA: Insufficient documentation

## 2011-12-10 DIAGNOSIS — I10 Essential (primary) hypertension: Secondary | ICD-10-CM | POA: Insufficient documentation

## 2011-12-10 DIAGNOSIS — Y9389 Activity, other specified: Secondary | ICD-10-CM | POA: Insufficient documentation

## 2011-12-10 DIAGNOSIS — X500XXA Overexertion from strenuous movement or load, initial encounter: Secondary | ICD-10-CM | POA: Insufficient documentation

## 2011-12-10 DIAGNOSIS — E119 Type 2 diabetes mellitus without complications: Secondary | ICD-10-CM | POA: Insufficient documentation

## 2011-12-10 LAB — GLUCOSE, CAPILLARY: Glucose-Capillary: 136 mg/dL — ABNORMAL HIGH (ref 70–99)

## 2011-12-10 MED ORDER — HYDROCODONE-ACETAMINOPHEN 5-325 MG PO TABS: 1.0000 | ORAL_TABLET | ORAL | Status: AC | PRN

## 2011-12-10 NOTE — ED Notes (Signed)
Pt has cough that has been present "for a long time".  Pt speaks Spanish.  Granddaughter speaks [place].

## 2011-12-10 NOTE — Discharge Instructions (Signed)
Fractura por compresin de la espalda (Back, Compression Fracture) Este trastorno ocurre cuando una fuerza se aplica a lo largo de la columna vertebral. Como la que se produce como consecuencia de un resbaln y [employer] glteos. Cuando esto ocurre, algunas veces la fuerza tiene la intensidad suficiente para comprimir los bloques (cuerpos vertebrales) de la columna vertebral. Aunque ocasiona un dolor intenso, en general puede tratarse en casa, a menos que el mdico crea que es necesaria la hospitalizacin. Su espina dorsal (columna vertebral) est conformada por 24 vrtebras principales, adems del sacro y el cccix (vase la ilustracin). Estn unidas entre s por estructuras fibrosas y resistentes (ligamentos), y tambin estn sostenidas por los msculos. Las races [address] pasan a travs de los agujeros [place] vrtebras. Un movimiento repentino de torsin, un traumatismo o una cada puede causar la fractura por compresin de uno de los cuerpos vertebrales. Esto puede dar como resultado un dolor que se desplace hacia el vientre (abdomen), los glteos y por la pierna hasta el pie. El dolor tambin puede estar originado slo en el espasmo muscular. Se han llevado a cabo estudios importantes para determinar el mejor curso de accin posible para lograr la curacin de su espalda luego de una lesin y tambin para prevenir problemas futuros. Las recomendaciones son las siguientes: LUEGO DE UNA FRACTURA POR COMPRESIN: Haga lo siguiente slo si se lo aconsej el profesional que lo asiste:   Si le han sugerido o provisto de un cors ortopdico, selo como se le ha indicado.   No deje de usarlo, a menos que el profesional que le asiste se lo indique.   Cuando le permitan regresar a la actividad normal, evite el estilo de vida sedentario. Practique ejercicios activamente. Los deportes practicados espordicamente los fines de semana como el tenis, el racquetball, el esqu acutico, pueden ms bien  agravar o generar problemas, especialmente si usted no se encuentra en buenas condiciones para realizar esa [place].   Evite los deportes que requieran de movimientos corporales bruscos hasta que se encuentre en condiciones para realizarlos. La natacin y las caminatas son las actividades ms seguras.   Mantenga una buena postura.   Evite la obesidad.   Si no se le ha hecho, se Horticulturist, commercial. Segn los [place], podr recibir un tratamiento para la osteoporosis.  LUEGO DE UNA LESIN AGUDA (REPENTINA):  Slo tome medicamentos de venta libre o prescriptos para Primary school teacher, las [place], o bajar la fiebre segn las indicaciones de su mdico.   Haga reposo en cama slo en caso de los episodios ms extremos y agudos. El descanso prolongado en cama puede agravar su trastorno. El hielo es muy eficaz en los episodios agudos. Use una bolsa plstica grande llena de hielo. Envulvala en una toalla. Esto proporciona un excelente alivio para Chief Technology Officer. Puede colocrsela de manera continua. O colocrsela de manera continua durante treinta minutos cada 2 horas durante la fase aguda, y luego cuando lo necesite. El calor durante treinta minutos antes de las actividades tambin puede ayudar.   Tan pronto como la fase aguda (el tiempo en el que la espalda est demasiado adolorida para que usted pueda Education officer, environmental actividades normales) haya finalizado, es muy importante que reinicie sus actividades normales o su trabajo en programas de fortalecimiento. Las lesiones en la espalda podran cambiar marcadamente su estilo de vida. Por ello es importante atacar el problema de [place].   Consulte al profesional que le asiste si [employer]. Podr ayudarle o referirle,  de ser necesario, para que [employer] ejercicios, la fisioterapia o los ejercicios de fortalecimiento apropiados.   Si se le han administrado narcticos para su enfermedad, durante las siguientes 24 horas no podr:    Science writer.   Manipular maquinaria o herramientas elctricas.   Firmar documentos legales.   No beba alcohol, no tome pldoras para dormir ni otros medicamentos que puedan interferir con Scientist, research (medical).  Si el mdico le ha dado fecha para una visita de control, es importante que concurra. No cumplir con este control puede dar como resultado que el dao, el dolor o la discapacidad sean permanentes. Si tiene problemas para asistir al control, deber [employer] establecimiento para recibir asesoramiento.  SOLICITE ATENCIN MDICA DE INMEDIATO SI:  Presenta entumecimiento, hormigueo, debilidad o problemas con el uso de los brazos o las piernas.   Dolor de espalda intenso que no mejora con medicamentos.   Cambios en el control del intestino o la vejiga.   Aumento del dolor en cualquier parte del cuerpo.  Document Released: 06/11/2005 Document Revised: 05/31/2011 [facility] Patient Information 2012 [place], [place].

## 2011-12-10 NOTE — ED Notes (Signed)
Pt was lifting a bucket of water a couple of days ago and felt something pop in lower back and states the area went to sleep, but now just hurts.  No incontinence of bowel or bladder and patient is still able to walk.  Pt feels like her blood sugar is high.  She takes pills for diabetes

## 2011-12-10 NOTE — ED Provider Notes (Signed)
History     CSN: [id number]  Arrival date & time 12/10/11  1327   First MD Initiated Contact with Patient 12/10/11 1926      Chief Complaint  Patient presents with  . Back Pain    (Consider location/radiation/quality/duration/timing/severity/associated sxs/prior treatment) HPI Comments: Tammy Willis is a 78 y.o. Female who has had pain in her mid-low back, for several days; after feeling a pop, when she bent over to pick up a bucket. She denies weakness, dizziness, paresthesias, bowel or urine incontinence. She's not tried a medication. The pain is worse with ambulation and bending forward.  Patient is a 78 y.o. female presenting with back pain. The history is provided by the patient and a relative.  Back Pain     Past Medical History  Diagnosis Date  . Hypertension   . Diabetes mellitus     Past Surgical History  Procedure Date  . Kidney stone surgery     No family history on file.  History  Substance Use Topics  . Smoking status: Never Smoker   . Smokeless tobacco: Not on file  . Alcohol Use: No    OB History    Grav Para Term Preterm Abortions TAB SAB Ect Mult Living                  Review of Systems  Musculoskeletal: Positive for back pain.  All other systems reviewed and are negative.    Allergies  Review of patient's allergies indicates no known allergies.  Home Medications   Current Outpatient Rx  Name Route Sig Dispense Refill  . GLIMEPIRIDE 4 MG PO TABS Oral Take 4 mg by mouth daily before breakfast.      . HYDROCODONE-ACETAMINOPHEN 5-325 MG PO TABS Oral Take 1 tablet by mouth every 4 (four) hours as needed for pain. 20 tablet 0  . LISINOPRIL-HYDROCHLOROTHIAZIDE 20-25 MG PO TABS Oral Take 1 tablet by mouth daily.      Marland Kitchen METFORMIN HCL 1000 MG PO TABS Oral Take 1,000 mg by mouth 2 (two) times daily with a meal.      . THERA M PLUS PO TABS Oral Take 1 tablet by mouth daily. Centrum silver       BP 178/119  Pulse 78  Temp 98.1 F  (36.7 C) (Oral)  Resp 18  SpO2 98%  Physical Exam  Nursing note and vitals reviewed. Constitutional: She is oriented to person, place, and time. She appears well-developed and well-nourished.  HENT:  Head: Normocephalic and atraumatic.  Eyes: Conjunctivae and EOM are normal. Pupils are equal, round, and reactive to light.  Neck: Normal range of motion and phonation normal. Neck supple.  Cardiovascular: Normal rate, regular rhythm and intact distal pulses.   Pulmonary/Chest: Effort normal and breath sounds normal. She exhibits no tenderness.       Mild right lower chest wall, tenderness, without crepitation or ecchymosis.  Abdominal: Soft. She exhibits no distension. There is no tenderness. There is no guarding.  Musculoskeletal:       Mild mid lower lumbar tenderness with normal range of motion  Neurological: She is alert and oriented to person, place, and time. She has normal strength. She exhibits normal muscle tone.  Skin: Skin is warm and dry.  Psychiatric: She has a normal mood and affect. Her behavior is normal. Judgment and thought content normal.    ED Course  Procedures (including critical care time)  Labs Reviewed  GLUCOSE, CAPILLARY - Abnormal; Notable for the following:  Glucose-Capillary 136 (*)     All other components within normal limits  LAB REPORT - SCANNED   Dg Chest 2 View  12/10/2011  *RADIOLOGY REPORT*  Clinical Data: Low back pain, shortness of breath.  CHEST - 2 VIEW  Comparison: 06/01/2011  Findings: Heart is upper limits normal in size.  No confluent airspace opacities or effusions.  No acute bony abnormality.  IMPRESSION: No active cardiopulmonary disease.  Original Report Authenticated By: Cyndie Chime, M.D.   Dg Lumbar Spine Complete  12/10/2011  *RADIOLOGY REPORT*  Clinical Data: Low back pain, shortness of breath.  LUMBAR SPINE - COMPLETE 4+ VIEW  Comparison: CT 01/09/2011  Findings: There is diffuse osteopenia.  Normal alignment.  Disc spaces  are maintained.  Mild compression fractures through the superior endplate of T12, new since prior CT.  No additional acute bony abnormality.  IMPRESSION: Mild compression through the superior endplate of T12, new since 07/12.  Severe diffuse osteopenia.  Original Report Authenticated By: Cyndie Chime, M.D.     1. Thoracic compression fracture       MDM  Acute T12 compression fracture. Doubt retropulsion or spinal myelopathY. Doubt metabolic instability, serious bacterial infection or impending vascular collapse; the patient is stable for discharge.   Plan: Home Medications- Norco; Home Treatments- rest; Recommended follow up- Ortho in 1 week       Flint Melter, MD 12/11/11 2017

## 2011-12-10 NOTE — ED Notes (Signed)
Check blood sugar it was good it was 136 notified RN [address]

## 2012-09-18 ENCOUNTER — Emergency Department (HOSPITAL_COMMUNITY)
Admission: EM | Admit: 2012-09-18 | Discharge: 2012-09-19 | Disposition: A | Discharge status: Home / Self Care | Payer: Self-pay | Attending: Emergency Medicine | Admitting: Emergency Medicine

## 2012-09-18 ENCOUNTER — Encounter (HOSPITAL_COMMUNITY): Payer: Self-pay | Admitting: *Deleted

## 2012-09-18 DIAGNOSIS — Y929 Unspecified place or not applicable: Secondary | ICD-10-CM | POA: Insufficient documentation

## 2012-09-18 DIAGNOSIS — X500XXA Overexertion from strenuous movement or load, initial encounter: Secondary | ICD-10-CM | POA: Insufficient documentation

## 2012-09-18 DIAGNOSIS — S0300XA Dislocation of jaw, unspecified side, initial encounter: Secondary | ICD-10-CM | POA: Insufficient documentation

## 2012-09-18 DIAGNOSIS — Y9389 Activity, other specified: Secondary | ICD-10-CM | POA: Insufficient documentation

## 2012-09-18 DIAGNOSIS — E119 Type 2 diabetes mellitus without complications: Secondary | ICD-10-CM | POA: Insufficient documentation

## 2012-09-18 DIAGNOSIS — I10 Essential (primary) hypertension: Secondary | ICD-10-CM | POA: Insufficient documentation

## 2012-09-18 DIAGNOSIS — Z79899 Other long term (current) drug therapy: Secondary | ICD-10-CM | POA: Insufficient documentation

## 2012-09-18 MED ORDER — PROPOFOL 10 MG/ML IV BOLUS: 0.5000 mg/kg | Freq: Once | INTRAVENOUS | Status: DC

## 2012-09-18 MED ORDER — MIDAZOLAM HCL 2 MG/2ML IJ SOLN
2.0000 mg | Freq: Once | INTRAMUSCULAR | Status: AC
  Administered 2012-09-19: 2 mg via INTRAVENOUS
  Filled 2012-09-18: qty 2

## 2012-09-18 MED ORDER — HYDROMORPHONE HCL PF 1 MG/ML IJ SOLN
1.0000 mg | Freq: Once | INTRAMUSCULAR | Status: AC
  Administered 2012-09-19: 1 mg via INTRAVENOUS
  Filled 2012-09-18: qty 1

## 2012-09-18 NOTE — ED Provider Notes (Addendum)
History     CSN: [id number]  Arrival date & time 09/18/12  2243   First MD Initiated Contact with Patient 09/18/12 2328      Chief Complaint  Patient presents with  . Mouth Injury    (Consider location/radiation/quality/duration/timing/severity/associated sxs/prior treatment) HPI Comments: Patient was yawning 1 hour ago and has been unable to close her mouth since. Patient does not speak English history is obtained through relatives. Denies any trauma or falls. Denies this problem happening before. She has a history of hypertension and diabetes. She has no difficulty breathing or swallowing. No chest pain or shortness of breath.  The history is provided by the patient and a relative. The history is limited by a language barrier. A language interpreter was used.    Past Medical History  Diagnosis Date  . Hypertension   . Diabetes mellitus     Past Surgical History  Procedure Laterality Date  . Kidney stone surgery      History reviewed. No pertinent family history.  History  Substance Use Topics  . Smoking status: Never Smoker   . Smokeless tobacco: Not on file  . Alcohol Use: No    OB History   Grav Para Term Preterm Abortions TAB SAB Ect Mult Living                  Review of Systems  Constitutional: Negative for fever, activity change and appetite change.  HENT: Negative for congestion and rhinorrhea.   Respiratory: Negative for cough, chest tightness and shortness of breath.   Cardiovascular: Negative for chest pain.  Gastrointestinal: Negative for abdominal pain.  Genitourinary: Negative for dysuria, hematuria, vaginal bleeding and vaginal discharge.  Musculoskeletal: Negative for back pain.  Neurological: Negative for dizziness, weakness and headaches.  A complete 10 system review of systems was obtained and all systems are negative except as noted in the HPI and PMH.    Allergies  Review of patient's allergies indicates no known allergies.  Home  Medications   Current Outpatient Rx  Name  Route  Sig  Dispense  Refill  . glimepiride (AMARYL) 4 MG tablet   Oral   Take 4 mg by mouth daily before breakfast.           . lisinopril-hydrochlorothiazide (PRINZIDE,ZESTORETIC) 20-25 MG per tablet   Oral   Take 1 tablet by mouth daily.           . metFORMIN (GLUCOPHAGE) 1000 MG tablet   Oral   Take 1,000 mg by mouth 2 (two) times daily with a meal.           . Multiple Vitamins-Minerals (MULTIVITAMINS THER. W/MINERALS) TABS   Oral   Take 1 tablet by mouth daily. Centrum silver            BP 216/61  Pulse 68  Temp(Src) 0 F (-17.8 C)  Resp 16  SpO2 100%  Physical Exam  Constitutional: She is oriented to person, place, and time. She appears well-developed and well-nourished.  HENT:  Head: Normocephalic and atraumatic.  Mouth/Throat: Oropharynx is clear and moist. No oropharyngeal exudate.  Unable to close mouth.  Palpable dislocation of TMJ  Eyes: Conjunctivae and EOM are normal. Pupils are equal, round, and reactive to light.  Neck: Normal range of motion. Neck supple.  Cardiovascular: Normal rate, regular rhythm and normal heart sounds.   No murmur heard. Pulmonary/Chest: Effort normal and breath sounds normal. No respiratory distress.  Abdominal: Soft. There is no tenderness. There is no  rebound and no guarding.  Musculoskeletal: Normal range of motion. She exhibits no edema and no tenderness.  Neurological: She is alert and oriented to person, place, and time. No cranial nerve deficit. She exhibits normal muscle tone. Coordination normal.  Skin: Skin is warm.    ED Course  Reduction of dislocation Date/Time: 09/19/2012 12:54 AM Performed by: Glynn Octave Authorized by: Glynn Octave Consent: written consent obtained. Risks and benefits: risks, benefits and alternatives were discussed Consent given by: patient Patient understanding: patient states understanding of the procedure being  performed Patient identity confirmed: verbally with patient Time out: Immediately prior to procedure a "time out" was called to verify the correct patient, procedure, equipment, support staff and site/side marked as required. Local anesthesia used: no Patient sedated: yes Sedatives: midazolam Analgesia: hydromorphone Vitals: Vital signs were monitored during sedation. Patient tolerance: Patient tolerated the procedure well with no immediate complications.  Procedural sedation Date/Time: 09/19/2012 3:51 AM Performed by: Glynn Octave Authorized by: Glynn Octave Consent: Verbal consent obtained. written consent obtained. Risks and benefits: risks, benefits and alternatives were discussed Consent given by: patient Patient understanding: patient states understanding of the procedure being performed Patient consent: the patient's understanding of the procedure matches consent given Procedure consent: procedure consent matches procedure scheduled Relevant documents: relevant documents present and verified Test results: test results available and properly labeled Site marked: the operative site was marked Imaging studies: imaging studies available Patient identity confirmed: verbally with patient Time out: Immediately prior to procedure a "time out" was called to verify the correct patient, procedure, equipment, support staff and site/side marked as required. Patient sedated: yes Sedatives: midazolam Analgesia: hydromorphone Sedation start date/time: 09/19/2012 12:43 AM Sedation end date/time: 09/19/2012 12:53 AM Vitals: Vital signs were monitored during sedation. Patient tolerance: Patient tolerated the procedure well with no immediate complications.   (including critical care time)  Labs Reviewed - No data to display No results found.   No diagnosis found.    MDM  TMJ dislocation without trauma. Patient is hypertensive with history of the same. Controlling secretions, no  distress.  Patient was given Versed for bedside reduction. Reduction was achieved. Patient had episode of apnea requiring bagging for 2 minutes. Saturations remained in the 90s.  Jaw dislocated again after reduction. Was reduced again without difficulty. It dislocated again after that. Discussed with ENT on call Dr. Emeline Darling who states he is never reduced the jaw and does not have any recommendations. There is no oral surgeon on call. Discussed with Dr. Oswaldo Done of dentistry who recommended keeping the jaw wrapped. Jaw was replaced again. reduction was successful. Will refer to oral surgery for followup.    Date: 09/19/2012  Rate: 101  Rhythm: sinus tachycardia  QRS Axis: normal  Intervals: normal  ST/T Wave abnormalities: ST depressions inferiorly and ST depressions laterally  Conduction Disutrbances:none  Narrative Interpretation: unchanged ST depressions in her  Old EKG Reviewed: unchanged      Glynn Octave, MD 09/19/12 7829  Glynn Octave, MD 09/19/12 5621  Glynn Octave, MD 10/01/12 1042

## 2012-09-18 NOTE — ED Notes (Signed)
Pt family states she was yawning an hour ago and jaw is "stuck open".  Pt unable to close mouth, c/o jaw pain.

## 2012-09-19 ENCOUNTER — Emergency Department (HOSPITAL_COMMUNITY): Payer: Self-pay

## 2012-09-19 ENCOUNTER — Encounter (HOSPITAL_COMMUNITY): Payer: Self-pay | Admitting: Radiology

## 2012-09-19 MED ORDER — ONDANSETRON HCL 4 MG/2ML IJ SOLN
4.0000 mg | Freq: Once | INTRAMUSCULAR | Status: AC
  Administered 2012-09-19: 4 mg via INTRAVENOUS
  Filled 2012-09-19: qty 2

## 2012-09-19 MED ORDER — PROPOFOL 10 MG/ML IV BOLUS
INTRAVENOUS | Status: AC
  Filled 2012-09-19: qty 20

## 2012-09-19 MED ORDER — FLUMAZENIL 0.5 MG/5ML IV SOLN
INTRAVENOUS | Status: AC
  Administered 2012-09-19: 0.2 mg
  Filled 2012-09-19: qty 5

## 2012-09-19 MED ORDER — ONDANSETRON HCL 4 MG PO TABS: 4.0000 mg | ORAL_TABLET | Freq: Four times a day (QID) | ORAL | Status: DC

## 2012-09-19 MED ORDER — HYDROCODONE-ACETAMINOPHEN 5-325 MG PO TABS: 2.0000 | ORAL_TABLET | ORAL | Status: DC | PRN

## 2012-09-19 MED ORDER — ONDANSETRON 4 MG PO TBDP
4.0000 mg | ORAL_TABLET | Freq: Once | ORAL | Status: AC
  Administered 2012-09-19: 4 mg via ORAL
  Filled 2012-09-19: qty 1

## 2012-09-19 NOTE — ED Notes (Signed)
Pt jaw relocated by EDP. Pt stats started dropping. Pt unable to follow commands to open and close mouth. AMBU bag applied and pt assisted with respirations.

## 2012-09-19 NOTE — ED Notes (Addendum)
Pt returned from x-ray with vomit on sheets, pt sheets changes and pt cleaned up.

## 2012-09-19 NOTE — ED Notes (Signed)
EDP at bedside to relocate jaw. Pt head wrapped to keep jaw in place.

## 2012-09-19 NOTE — ED Notes (Signed)
Pt wheeled out by wheelchair. Pt ambulatory with assistance per norm per grandson. Pt denies pain or HA,

## 2012-09-19 NOTE — ED Notes (Signed)
Pt ambulated to bathroom, pt complaining of dizziness, EDP aware and wants a scan of her head. Family informed of delay.

## 2012-09-19 NOTE — ED Notes (Signed)
Pt now able to follow commands and open and close mouth. Pt remains sleepy, pt easily arousable.

## 2013-12-23 DIAGNOSIS — K8050 Calculus of bile duct without cholangitis or cholecystitis without obstruction: Secondary | ICD-10-CM

## 2013-12-23 HISTORY — DX: Calculus of bile duct without cholangitis or cholecystitis without obstruction: K80.50

## 2014-01-11 ENCOUNTER — Encounter (HOSPITAL_COMMUNITY): Payer: Self-pay | Admitting: Emergency Medicine

## 2014-01-11 ENCOUNTER — Emergency Department (HOSPITAL_COMMUNITY): Payer: Medicaid Other

## 2014-01-11 ENCOUNTER — Emergency Department (HOSPITAL_COMMUNITY)
Admission: EM | Admit: 2014-01-11 | Discharge: 2014-01-11 | Disposition: A | Discharge status: Nursing Home (Includes ICF) | Payer: No Typology Code available for payment source | Source: Home / Self Care | Attending: Emergency Medicine | Admitting: Emergency Medicine

## 2014-01-11 ENCOUNTER — Inpatient Hospital Stay (HOSPITAL_COMMUNITY)
Admission: EM | Admit: 2014-01-11 | Discharge: 2014-01-19 | DRG: 444 | Disposition: A | Discharge status: Home Health | Payer: Medicaid Other | Attending: Internal Medicine | Admitting: Internal Medicine

## 2014-01-11 DIAGNOSIS — I129 Hypertensive chronic kidney disease with stage 1 through stage 4 chronic kidney disease, or unspecified chronic kidney disease: Secondary | ICD-10-CM | POA: Diagnosis present

## 2014-01-11 DIAGNOSIS — N183 Chronic kidney disease, stage 3 unspecified: Secondary | ICD-10-CM | POA: Diagnosis present

## 2014-01-11 DIAGNOSIS — Z23 Encounter for immunization: Secondary | ICD-10-CM

## 2014-01-11 DIAGNOSIS — R5383 Other fatigue: Secondary | ICD-10-CM

## 2014-01-11 DIAGNOSIS — R945 Abnormal results of liver function studies: Secondary | ICD-10-CM | POA: Diagnosis present

## 2014-01-11 DIAGNOSIS — N289 Disorder of kidney and ureter, unspecified: Secondary | ICD-10-CM | POA: Diagnosis present

## 2014-01-11 DIAGNOSIS — R1310 Dysphagia, unspecified: Secondary | ICD-10-CM | POA: Diagnosis present

## 2014-01-11 DIAGNOSIS — E119 Type 2 diabetes mellitus without complications: Secondary | ICD-10-CM | POA: Diagnosis present

## 2014-01-11 DIAGNOSIS — R531 Weakness: Secondary | ICD-10-CM

## 2014-01-11 DIAGNOSIS — E1129 Type 2 diabetes mellitus with other diabetic kidney complication: Secondary | ICD-10-CM | POA: Diagnosis present

## 2014-01-11 DIAGNOSIS — R410 Disorientation, unspecified: Secondary | ICD-10-CM

## 2014-01-11 DIAGNOSIS — E1165 Type 2 diabetes mellitus with hyperglycemia: Secondary | ICD-10-CM

## 2014-01-11 DIAGNOSIS — R932 Abnormal findings on diagnostic imaging of liver and biliary tract: Secondary | ICD-10-CM

## 2014-01-11 DIAGNOSIS — E876 Hypokalemia: Secondary | ICD-10-CM | POA: Diagnosis present

## 2014-01-11 DIAGNOSIS — R7989 Other specified abnormal findings of blood chemistry: Secondary | ICD-10-CM | POA: Diagnosis present

## 2014-01-11 DIAGNOSIS — R1011 Right upper quadrant pain: Secondary | ICD-10-CM

## 2014-01-11 DIAGNOSIS — R5381 Other malaise: Secondary | ICD-10-CM

## 2014-01-11 DIAGNOSIS — F29 Unspecified psychosis not due to a substance or known physiological condition: Secondary | ICD-10-CM

## 2014-01-11 DIAGNOSIS — Z681 Body mass index (BMI) 19 or less, adult: Secondary | ICD-10-CM | POA: Diagnosis not present

## 2014-01-11 DIAGNOSIS — IMO0002 Reserved for concepts with insufficient information to code with codable children: Secondary | ICD-10-CM | POA: Diagnosis not present

## 2014-01-11 DIAGNOSIS — K449 Diaphragmatic hernia without obstruction or gangrene: Secondary | ICD-10-CM | POA: Diagnosis present

## 2014-01-11 DIAGNOSIS — K811 Chronic cholecystitis: Secondary | ICD-10-CM

## 2014-01-11 DIAGNOSIS — E43 Unspecified severe protein-calorie malnutrition: Secondary | ICD-10-CM | POA: Diagnosis present

## 2014-01-11 DIAGNOSIS — I10 Essential (primary) hypertension: Secondary | ICD-10-CM | POA: Diagnosis present

## 2014-01-11 DIAGNOSIS — R109 Unspecified abdominal pain: Secondary | ICD-10-CM | POA: Diagnosis present

## 2014-01-11 DIAGNOSIS — K8051 Calculus of bile duct without cholangitis or cholecystitis with obstruction: Secondary | ICD-10-CM | POA: Diagnosis present

## 2014-01-11 DIAGNOSIS — D696 Thrombocytopenia, unspecified: Secondary | ICD-10-CM | POA: Diagnosis present

## 2014-01-11 DIAGNOSIS — R112 Nausea with vomiting, unspecified: Secondary | ICD-10-CM

## 2014-01-11 HISTORY — DX: Calculus of bile duct without cholangitis or cholecystitis with obstruction: K80.51

## 2014-01-11 LAB — LIPASE, BLOOD: LIPASE: 15 U/L (ref 11–59)

## 2014-01-11 LAB — I-STAT TROPONIN, ED: Troponin i, poc: 0.01 ng/mL (ref 0.00–0.08)

## 2014-01-11 LAB — CBC WITH DIFFERENTIAL/PLATELET
Basophils Absolute: 0 10*3/uL (ref 0.0–0.1)
Basophils Relative: 0 % (ref 0–1)
EOS ABS: 0 10*3/uL (ref 0.0–0.7)
EOS PCT: 0 % (ref 0–5)
HEMATOCRIT: 40.3 % (ref 36.0–46.0)
Hemoglobin: 13 g/dL (ref 12.0–15.0)
LYMPHS ABS: 0.5 10*3/uL — AB (ref 0.7–4.0)
LYMPHS PCT: 5 % — AB (ref 12–46)
MCH: 29.5 pg (ref 26.0–34.0)
MCHC: 32.3 g/dL (ref 30.0–36.0)
MCV: 91.4 fL (ref 78.0–100.0)
MONO ABS: 0.3 10*3/uL (ref 0.1–1.0)
Monocytes Relative: 3 % (ref 3–12)
Neutro Abs: 9.8 10*3/uL — ABNORMAL HIGH (ref 1.7–7.7)
Neutrophils Relative %: 92 % — ABNORMAL HIGH (ref 43–77)
PLATELETS: 178 10*3/uL (ref 150–400)
RBC: 4.41 MIL/uL (ref 3.87–5.11)
RDW: 16 % — ABNORMAL HIGH (ref 11.5–15.5)
WBC: 10.7 10*3/uL — ABNORMAL HIGH (ref 4.0–10.5)

## 2014-01-11 LAB — COMPREHENSIVE METABOLIC PANEL
ALT: 199 U/L — AB (ref 0–35)
ANION GAP: 14 (ref 5–15)
AST: 458 U/L — ABNORMAL HIGH (ref 0–37)
Albumin: 3 g/dL — ABNORMAL LOW (ref 3.5–5.2)
Alkaline Phosphatase: 277 U/L — ABNORMAL HIGH (ref 39–117)
BUN: 18 mg/dL (ref 6–23)
CALCIUM: 9.2 mg/dL (ref 8.4–10.5)
CO2: 24 meq/L (ref 19–32)
CREATININE: 1.55 mg/dL — AB (ref 0.50–1.10)
Chloride: 100 mEq/L (ref 96–112)
GFR, EST AFRICAN AMERICAN: 34 mL/min — AB (ref >90)
GFR, EST NON AFRICAN AMERICAN: 29 mL/min — AB (ref >90)
GLUCOSE: 160 mg/dL — AB (ref 70–99)
Potassium: 4.7 mEq/L (ref 3.7–5.3)
SODIUM: 138 meq/L (ref 137–147)
TOTAL PROTEIN: 7.4 g/dL (ref 6.0–8.3)
Total Bilirubin: 1.6 mg/dL — ABNORMAL HIGH (ref 0.3–1.2)

## 2014-01-11 LAB — I-STAT CG4 LACTIC ACID, ED: Lactic Acid, Venous: 2.29 mmol/L — ABNORMAL HIGH (ref 0.5–2.2)

## 2014-01-11 LAB — GLUCOSE, CAPILLARY
Glucose-Capillary: 184 mg/dL — ABNORMAL HIGH (ref 70–99)
Glucose-Capillary: 168 mg/dL — ABNORMAL HIGH (ref 70–99)

## 2014-01-11 MED ORDER — SODIUM CHLORIDE 0.9 % IV SOLN
INTRAVENOUS | Status: AC
  Administered 2014-01-11: via INTRAVENOUS

## 2014-01-11 MED ORDER — CIPROFLOXACIN IN D5W 400 MG/200ML IV SOLN
400.0000 mg | Freq: Two times a day (BID) | INTRAVENOUS | Status: DC
  Administered 2014-01-11: 400 mg via INTRAVENOUS
  Filled 2014-01-11 (×3): qty 200

## 2014-01-11 MED ORDER — ONDANSETRON HCL 4 MG/2ML IJ SOLN: 4.0000 mg | Freq: Three times a day (TID) | INTRAMUSCULAR | Status: AC | PRN

## 2014-01-11 MED ORDER — HEPARIN SODIUM (PORCINE) 5000 UNIT/ML IJ SOLN
5000.0000 [IU] | Freq: Three times a day (TID) | INTRAMUSCULAR | Status: DC
  Administered 2014-01-11 – 2014-01-13 (×4): 5000 [IU] via SUBCUTANEOUS
  Filled 2014-01-11 (×6): qty 1

## 2014-01-11 MED ORDER — METRONIDAZOLE IN NACL 5-0.79 MG/ML-% IV SOLN
500.0000 mg | Freq: Three times a day (TID) | INTRAVENOUS | Status: DC
  Administered 2014-01-11 – 2014-01-12 (×2): 500 mg via INTRAVENOUS
  Filled 2014-01-11 (×5): qty 100

## 2014-01-11 MED ORDER — LABETALOL HCL 5 MG/ML IV SOLN
5.0000 mg | INTRAVENOUS | Status: DC | PRN
  Administered 2014-01-11: 10 mg via INTRAVENOUS
  Filled 2014-01-11 (×2): qty 4

## 2014-01-11 MED ORDER — SODIUM CHLORIDE 0.9 % IV SOLN
INTRAVENOUS | Status: DC
  Administered 2014-01-11 – 2014-01-14 (×4): via INTRAVENOUS

## 2014-01-11 MED ORDER — INSULIN ASPART 100 UNIT/ML ~~LOC~~ SOLN
0.0000 [IU] | SUBCUTANEOUS | Status: DC
  Administered 2014-01-11: 2 [IU] via SUBCUTANEOUS
  Administered 2014-01-13 – 2014-01-14 (×2): 3 [IU] via SUBCUTANEOUS
  Administered 2014-01-15 (×2): 1 [IU] via SUBCUTANEOUS
  Administered 2014-01-15: 7 [IU] via SUBCUTANEOUS
  Administered 2014-01-16 (×4): 2 [IU] via SUBCUTANEOUS
  Administered 2014-01-17: 1 [IU] via SUBCUTANEOUS
  Administered 2014-01-17: 2 [IU] via SUBCUTANEOUS
  Administered 2014-01-18: 3 [IU] via SUBCUTANEOUS

## 2014-01-11 MED ORDER — GI COCKTAIL ~~LOC~~
30.0000 mL | Freq: Three times a day (TID) | ORAL | Status: DC | PRN
  Filled 2014-01-11 (×2): qty 30

## 2014-01-11 NOTE — ED Notes (Signed)
Pt speaks spanish. Phone interpreter used. Per pt family member, pt having RLQ abdomianl pain, bilateral leg pain, and weakness. Pt is unable to provide the date or where she is. Pt family member states they think she might have hx of dementia. RLQ pain started yesterday. Denies N/V/D or problems urinating/unusual discharge. Pt has full ROM with all extremities.

## 2014-01-11 NOTE — ED Notes (Addendum)
Caregiver reports  Pt  Has  Been  Weak  -  Lethargic       For  sev  Days  Had  Some  abd  Pain    yearlier      But  None    Now            Pt is  A  Diabetic        Who  Takes  Metformin           Her  Skin is  Warm and  Dry   She  Is awake  And  Responsive  However   is  Displays  Some  Lethargy       English is  Limited  As  Well

## 2014-01-11 NOTE — ED Notes (Signed)
Per pt family pt has been very weak and abdominal discomfort since yesterday. sts she has been falling.

## 2014-01-11 NOTE — H&P (Signed)
[facility] History and Physical  Tammy Willis WUJ:[id number] DOB: 09-05-1924 DOA: 01/11/2014  Referring physician: EDP PCP: Pcp Not In System   Chief Complaint: Abdominal pain   HPI: Tammy Willis is a 80 y.o. female with PMH of DM2, HTN, appendectomy, presents to ED with son (who interprets for patient as she speaks spanish only) with c/o abdominal pain and lethargy.  Yesterday patient complained of abdominal pain.  No fever, does report N/V with "every meal she eats" for the past 2-3 months or so.  Also has worse symptoms with spicy foods.  Pain improved after onset yesterday but today patient feels much more weak, off balance, and having difficulty walking at home.  Patient does typically walk but needs help with ADLs, family suspects she may have some dementia (although not formally diagnosed) but mental status was worse this morning (described as "talking to dead relatives").  Patient takes metformin for DM, but takes no other medications family reports (not currently on treatment for HTN).  Family brought patient to UC who referred her to ED for evaluation.  Review of Systems: Systems reviewed.  As above, otherwise negative  Past Medical History  Diagnosis Date  . Hypertension   . Diabetes mellitus    Past Surgical History  Procedure Laterality Date  . Kidney stone surgery     Social History:  reports that she has never smoked. She does not have any smokeless tobacco history on file. She reports that she does not drink alcohol or use illicit drugs.  No Known Allergies  History reviewed. No pertinent family history.   Prior to Admission medications   Medication Sig Start Date End Date Taking? Authorizing Provider  glimepiride (AMARYL) 4 MG tablet Take 4 mg by mouth daily before breakfast.     Yes Historical Provider, MD  ibuprofen (ADVIL,MOTRIN) 100 MG/5ML suspension Take 200 mg by mouth daily as needed for fever or mild pain.   Yes Historical Provider,  MD  lisinopril-hydrochlorothiazide (PRINZIDE,ZESTORETIC) 20-25 MG per tablet Take 1 tablet by mouth daily.     Yes Historical Provider, MD  metFORMIN (GLUCOPHAGE) 1000 MG tablet Take 1,000 mg by mouth 2 (two) times daily with a meal.     Yes Historical Provider, MD  Multiple Vitamins-Minerals (MULTIVITAMINS THER. W/MINERALS) TABS Take 1 tablet by mouth daily. Centrum silver    Yes Historical Provider, MD   Physical Exam: Filed Vitals:   01/11/14 2115  BP: 206/89  Pulse: 74  Temp:   Resp: 14    BP 206/89  Pulse 74  Temp(Src) 99.1 F (37.3 C) (Oral)  Resp 14  SpO2 100%  General Appearance:    Alert, oriented, no distress, appears stated age  Head:    Normocephalic, atraumatic  Eyes:    PERRL, EOMI, sclera non-icteric        Nose:   Nares without drainage or epistaxis. Mucosa, turbinates normal  Throat:   Moist mucous membranes. Oropharynx without erythema or exudate.  Neck:   Supple. No carotid bruits.  No thyromegaly.  No lymphadenopathy.   Back:     No CVA tenderness, no spinal tenderness  Lungs:     Clear to auscultation bilaterally, without wheezes, rhonchi or rales  Chest wall:    No tenderness to palpitation  Heart:    Regular rate and rhythm without murmurs, gallops, rubs  Abdomen:     Soft, non-tender, nondistended, normal bowel sounds, no organomegaly  Genitalia:    deferred  Rectal:    deferred  Extremities:  No clubbing, cyanosis or edema.  Pulses:   2+ and symmetric all extremities  Skin:   Skin color, texture, turgor normal, no rashes or lesions  Lymph nodes:   Cervical, supraclavicular, and axillary nodes normal  Neurologic:   CNII-XII intact. Normal strength, sensation and reflexes      throughout    Labs on Admission:  Basic Metabolic Panel:  Recent Labs Lab 01/11/14 1713  NA 138  K 4.7  CL 100  CO2 24  GLUCOSE 160*  BUN 18  CREATININE 1.55*  CALCIUM 9.2   Liver Function Tests:  Recent Labs Lab 01/11/14 1713  AST 458*  ALT 199*  ALKPHOS  277*  BILITOT 1.6*  PROT 7.4  ALBUMIN 3.0*    Recent Labs Lab 01/11/14 1713  LIPASE 15   No results found for this basename: AMMONIA,  in the last 168 hours CBC:  Recent Labs Lab 01/11/14 1713  WBC 10.7*  NEUTROABS 9.8*  HGB 13.0  HCT 40.3  MCV 91.4  PLT 178   Cardiac Enzymes: No results found for this basename: CKTOTAL, CKMB, CKMBINDEX, TROPONINI,  in the last 168 hours  BNP (last 3 results) No results found for this basename: PROBNP,  in the last 8760 hours CBG:  Recent Labs Lab 01/11/14 1324  GLUCAP 184*    Radiological Exams on Admission: Ct Head Wo Contrast  01/11/2014   CLINICAL DATA:  Weakness and confusion.  Falls.  EXAM: CT HEAD WITHOUT CONTRAST  TECHNIQUE: Contiguous axial images were obtained from the base of the skull through the vertex without intravenous contrast.  COMPARISON:  08/2012  FINDINGS: There is no evidence of acute cortical infarct, intracranial hemorrhage, mass, midline shift, or extra-axial fluid collection. Moderate cerebral atrophy is unchanged. Patchy and confluent hypodensities in the subcortical and periventricular white matter are similar to the prior study and compatible with moderate chronic small vessel ischemic disease.  Prior left cataract extraction is noted. Mastoid air cells and visualized paranasal sinuses are clear. Mild carotid siphon calcification is noted bilaterally.  IMPRESSION: No evidence of acute intracranial abnormality. Moderate chronic small vessel ischemic disease.   Electronically Signed   By: Logan Bores   On: 01/11/2014 18:55   US Abdomen Complete  01/11/2014   CLINICAL DATA:  Elevated liver function tests.  Abdominal pain.  EXAM: ULTRASOUND ABDOMEN COMPLETE  COMPARISON:  CT of the abdomen and pelvis without contrast 01/09/2011.  FINDINGS: Gallbladder:  There is some echogenic material lying dependently within the gallbladder which does not shadow, compatible with tumefactive sludge. Gallbladder appears moderately  distended. Gallbladder wall is thickened at 6 mm. However, there is no pericholecystic fluid, and per report, the patient did not exhibit a sonographic Murphy's sign on examination.  Common bile duct:  Diameter: 8.5 mm in the porta hepatis.  Liver:  Hepatic echotexture is heterogeneous with multifocal areas of increased echogenicity and slightly nodular contour, suggestive of underlying cirrhosis. Mild prominence of intrahepatic bile ducts.  IVC:  No abnormality visualized.  Pancreas:  Unremarkable.  Spleen:  Size and appearance within normal limits.  6.6 cm in length.  Right Kidney:  Length: 9.2 cm. Echogenicity within normal limits. 3.1 x 1.8 x 1.2 cm anechoic lesion in the parapelvic aspect of the lower pole of the right kidney, compatible with parapelvic cyst. No hydronephrosis visualized.  Left Kidney:  Length: 10.3 cm. Echogenicity within normal limits. In the interpolar region there is a 1.7 x 1.4 x 1.3 cm lesion that is generally anechoic with increased  through transmission, but contains a thin internal septation, most compatible with a mildly complex cyst. No hydronephrosis visualized.  Abdominal aorta:  No aneurysm visualized.  Multiple atherosclerotic calcifications.  Other findings:  None.  IMPRESSION: 1. Tumefactive sludge in the gallbladder. The gallbladder wall is mildly thickened, and the gallbladder is moderately distended, however, per report, the patient did not exhibit a sonographic Murphy's sign on examination. Overall, findings are equivocal for acute cholecystitis and clinical correlation is recommended. It is worth noting, however, that the gallbladder wall appeared markedly thickened on remote prior CT examination 01/09/2011, which suggests at this may be chronic in this individual. 2. Mild intra and extrahepatic biliary ductal dilatation. The possibility of sludge in the common bile duct should be considered. This could be further evaluated with MRCP if clinically appropriate. 3. Nodular  liver contour and heterogeneous echotexture of the liver parenchyma, suggestive of cirrhosis. 4. Additional findings, as above.   Electronically Signed   By: Vinnie Langton M.D.   On: 01/11/2014 20:08   Dg Abd Acute W/chest  01/11/2014   CLINICAL DATA:  Abdominal pain.  EXAM: ACUTE ABDOMEN SERIES (ABDOMEN 2 VIEW & CHEST 1 VIEW)  COMPARISON:  Chest x-ray 12/10/2011  FINDINGS: Heart is normal size. Tortuous, calcified aorta. Lungs are clear. No effusions.  Gas within nondistended large and small bowel. No evidence of free air, organomegaly or suspicious calcification. Moderate stool burden in the colon.  No acute bony abnormality.  IMPRESSION: No evidence of bowel obstruction or free air. Moderate stool burden.  No acute cardiopulmonary disease.   Electronically Signed   By: Rolm Baptise M.D.   On: 01/11/2014 19:08    EKG: Independently reviewed.  Assessment/Plan Principal Problem:   Cholecystitis, chronic Active Problems:   Diabetes mellitus   HTN (hypertension)   Abnormal LFTs   Dysphagia   Acute renal insufficiency   1. Chronic cholecystitis - sludge in gallbladder, transaminitis, dialation of biliary duct and thickening of gall bladder wall, and 3 month h/o N/V with eating are all suspicious for gallbladder as the source of this patients problems.  DDX does include GERD, diabetic gastroparesis, and other numerous causes of dysphagia.  ED called GI who wants medicine to admit and will see patient in AM.  Patient reports no abdominal pain at present time. 2. Dysphagia - patient failed bedside swallow stating "water got stuck", will keep patient NPO, on IVF, and have formal swallow eval tomorrow. 3. DM - holding metformin and putting patient on low dose SSI q4h 4. Acute renal insufficiency - suspect a component of dehydration due to poor PO intake, putting patient on NS at 75 cc/hr for gentle hydration, repeat in AM, holding metformin, has not been on lisinopril / HCTZ for some time. 5. HTN -  using labetalol to control BP PRN in hospital, has been off of lisinopril / HCTZ for some time.     Code Status: Full Code  Family Communication: Family at bedside Disposition Plan: Admit to inpatient   Time spent: 70 min  GARDNER, JARED M. [facility] Pager [phone]  If 7AM-7PM, please contact the day team taking care of the patient [email or web address] Password [email or web address] 01/11/2014, 9:54 PM

## 2014-01-11 NOTE — ED Provider Notes (Signed)
CSN: [id number]     Arrival date & time 01/11/14  1421 History   First MD Initiated Contact with Patient 01/11/14 1724     Chief Complaint  Patient presents with  . Abdominal Pain  . Weakness     (Consider location/radiation/quality/duration/timing/severity/associated sxs/prior Treatment) HPI Comments: Patient with history of appendectomy, diabetes, high blood pressure -- presents with son who interprets with complaint of abdominal pain and lethargy. Yesterday the patient complained of abdominal pain. She did not have fever, nausea, vomiting, or diarrhea. She was given Advil, which she had some difficulty swallowing, but the pain improved. Today the patient was much more weak, off balance, and having difficulty walking at home. She is complaining of pain from her knees to her feet bilaterally. Typically she walks but needs help with ADLs. She was talking to dead relatives this morning. They brought her to urgent care who then referred her to the emergency department for further evaluation. Patient takes metformin for her high blood sugar. Patient is from [place] and speaks [place]. The onset of this condition was acute. The course is constant. Aggravating factors: none. Alleviating factors: none.    Patient is a 80 y.o. female presenting with abdominal pain and weakness. The history is provided by the patient.  Abdominal Pain Associated symptoms: no chest pain, no cough, no diarrhea, no dysuria, no fever, no nausea, no shortness of breath, no sore throat and no vomiting   Weakness Associated symptoms include abdominal pain and weakness. Pertinent negatives include no chest pain, coughing, fever, headaches, myalgias, nausea, rash, sore throat or vomiting.    Past Medical History  Diagnosis Date  . Hypertension   . Diabetes mellitus    Past Surgical History  Procedure Laterality Date  . Kidney stone surgery     History reviewed. No pertinent family history. History  Substance Use Topics    . Smoking status: Never Smoker   . Smokeless tobacco: Not on file  . Alcohol Use: No   OB History   Grav Para Term Preterm Abortions TAB SAB Ect Mult Living                 Review of Systems  Constitutional: Negative for fever.  HENT: Negative for rhinorrhea and sore throat.   Eyes: Negative for redness.  Respiratory: Negative for cough and shortness of breath.   Cardiovascular: Negative for chest pain.  Gastrointestinal: Positive for abdominal pain. Negative for nausea, vomiting and diarrhea.  Genitourinary: Negative for dysuria.  Musculoskeletal: Positive for gait problem. Negative for myalgias.  Skin: Negative for rash.  Neurological: Positive for weakness. Negative for headaches.   Allergies  Review of patient's allergies indicates no known allergies.  Home Medications   Prior to Admission medications   Medication Sig Start Date End Date Taking? Authorizing Provider  glimepiride (AMARYL) 4 MG tablet Take 4 mg by mouth daily before breakfast.     Yes Historical Provider, MD  ibuprofen (ADVIL,MOTRIN) 100 MG/5ML suspension Take 200 mg by mouth daily as needed for fever or mild pain.   Yes Historical Provider, MD  lisinopril-hydrochlorothiazide (PRINZIDE,ZESTORETIC) 20-25 MG per tablet Take 1 tablet by mouth daily.     Yes Historical Provider, MD  metFORMIN (GLUCOPHAGE) 1000 MG tablet Take 1,000 mg by mouth 2 (two) times daily with a meal.     Yes Historical Provider, MD  Multiple Vitamins-Minerals (MULTIVITAMINS THER. W/MINERALS) TABS Take 1 tablet by mouth daily. Centrum silver    Yes Historical Provider, MD   BP 218/88  Pulse 79  Temp(Src) 99.1 F (37.3 C) (Oral)  Resp 16  SpO2 98%  Physical Exam  Nursing note and vitals reviewed. Constitutional: She appears well-developed and well-nourished.  HENT:  Head: Normocephalic and atraumatic.  Eyes: Conjunctivae are normal. Right eye exhibits no discharge. Left eye exhibits no discharge.  Neck: Normal range of motion.  Neck supple.  Cardiovascular: Normal rate, regular rhythm and normal heart sounds.  Frequent extrasystoles are present.  Pulses:      Radial pulses are 2+ on the right side, and 2+ on the left side.       Dorsalis pedis pulses are 2+ on the right side, and 2+ on the left side.  Pulmonary/Chest: Effort normal and breath sounds normal.  Abdominal: Soft. There is tenderness. There is no rigidity, no rebound, no guarding, no tenderness at McBurney's point and negative Murphy's sign.    Musculoskeletal: She exhibits no edema and no tenderness.  No lower extremity edema.  Neurological: She is alert. GCS eye subscore is 4. GCS verbal subscore is 5. GCS motor subscore is 6.  Complete exam very difficult due to language barrier and patient is very weak. She can lift R leg off of bed but cannot lift L leg off bed. Normal UE strength.   Skin: Skin is warm and dry.  Psychiatric: She has a normal mood and affect.    ED Course  Procedures (including critical care time) Labs Review Labs Reviewed  CBC WITH DIFFERENTIAL - Abnormal; Notable for the following:    WBC 10.7 (*)    RDW 16.0 (*)    Neutrophils Relative % 92 (*)    Neutro Abs 9.8 (*)    Lymphocytes Relative 5 (*)    Lymphs Abs 0.5 (*)    All other components within normal limits  COMPREHENSIVE METABOLIC PANEL - Abnormal; Notable for the following:    Glucose, Bld 160 (*)    Creatinine, Ser 1.55 (*)    Albumin 3.0 (*)    AST 458 (*)    ALT 199 (*)    Alkaline Phosphatase 277 (*)    Total Bilirubin 1.6 (*)    GFR calc non Af Amer 29 (*)    GFR calc Af Amer 34 (*)    All other components within normal limits  LIPASE, BLOOD  URINALYSIS, ROUTINE W REFLEX MICROSCOPIC  I-STAT CG4 LACTIC ACID, ED  I-STAT TROPOININ, ED    Imaging Review Ct Head Wo Contrast  01/11/2014   CLINICAL DATA:  Weakness and confusion.  Falls.  EXAM: CT HEAD WITHOUT CONTRAST  TECHNIQUE: Contiguous axial images were obtained from the base of the skull through  the vertex without intravenous contrast.  COMPARISON:  08/2012  FINDINGS: There is no evidence of acute cortical infarct, intracranial hemorrhage, mass, midline shift, or extra-axial fluid collection. Moderate cerebral atrophy is unchanged. Patchy and confluent hypodensities in the subcortical and periventricular white matter are similar to the prior study and compatible with moderate chronic small vessel ischemic disease.  Prior left cataract extraction is noted. Mastoid air cells and visualized paranasal sinuses are clear. Mild carotid siphon calcification is noted bilaterally.  IMPRESSION: No evidence of acute intracranial abnormality. Moderate chronic small vessel ischemic disease.   Electronically Signed   By: Logan Bores   On: 01/11/2014 18:55     EKG Interpretation None      5:39 PM Patient seen and examined. Work-up initiated. It is unclear what is causing these symptoms. Will need to rule out stroke, ACS,  infection, other abdominal etiology although she has only minimal tenderness now.    Vital signs reviewed and are as follows: Filed Vitals:   01/11/14 1707  BP: 218/88  Pulse: 79  Temp:   Resp: 16   BP noted.   7:02 PM Pt discussed with Dr. Audie Pinto. [place] ordered given new transaminitis. Will need admission. Pending imaging.   8:28 PM Handoff to Dr. Audie Pinto at shift change.   MDM   Final diagnoses:  None   Pending completion of work-up.     Carlisle Cater, PA-C 01/11/14 2029

## 2014-01-11 NOTE — ED Provider Notes (Signed)
CSN: [id number]     Arrival date & time 01/11/14  1250 History   None    Chief Complaint  Patient presents with  . Weakness   (Consider location/radiation/quality/duration/timing/severity/associated sxs/prior Treatment) HPI Patient is here with her daughter and son for abdominal pain and lethargy. An interpreter was present for the entire visit. Family reports that yesterday she had some abdominal pain. They gave her some Advil, which relieved the pain. However, they noted that it was difficult for her to swallow the Advil. They state they have noticed her having some difficulty with swallowing for the last few days at least. She does not have any abdominal pain today. Her appetite is decreased since yesterday. Family reports she continues to drink some water. Denies any nausea, vomiting, diarrhea. Last bowel movement was this morning and is reported as normal. Family also reports that this morning she was speaking to people who are deceased. Her son states she did this once before about a year ago, but this is not a regular occurrence. No cough, shortness of breath, chest pain.  The patient states that if she was not so weak, she would be able to walk.  Past Medical History  Diagnosis Date  . Hypertension   . Diabetes mellitus    Past Surgical History  Procedure Laterality Date  . Kidney stone surgery     History reviewed. No pertinent family history. History  Substance Use Topics  . Smoking status: Never Smoker   . Smokeless tobacco: Not on file  . Alcohol Use: No   OB History   Grav Para Term Preterm Abortions TAB SAB Ect Mult Living                 Review of Systems  Constitutional: Positive for appetite change and fatigue. Negative for fever.  HENT: Negative.   Eyes: Negative.   Respiratory: Negative.   Cardiovascular: Negative.   Gastrointestinal: Positive for abdominal pain. Negative for nausea, vomiting, diarrhea, constipation and blood in stool.  Genitourinary:  Negative.   Musculoskeletal: Negative.   Skin: Negative.   Neurological: Negative.   Psychiatric/Behavioral: Positive for hallucinations (talking to deceased friends).    Allergies  Review of patient's allergies indicates no known allergies.  Home Medications   Prior to Admission medications   Medication Sig Start Date End Date Taking? Authorizing Provider  glimepiride (AMARYL) 4 MG tablet Take 4 mg by mouth daily before breakfast.      Historical Provider, MD  HYDROcodone-acetaminophen (NORCO/VICODIN) 5-325 MG per tablet Take 2 tablets by mouth every 4 (four) hours as needed for pain. 09/19/12   Ezequiel Essex, MD  lisinopril-hydrochlorothiazide (PRINZIDE,ZESTORETIC) 20-25 MG per tablet Take 1 tablet by mouth daily.      Historical Provider, MD  metFORMIN (GLUCOPHAGE) 1000 MG tablet Take 1,000 mg by mouth 2 (two) times daily with a meal.      Historical Provider, MD  Multiple Vitamins-Minerals (MULTIVITAMINS THER. W/MINERALS) TABS Take 1 tablet by mouth daily. Centrum silver     Historical Provider, MD  ondansetron (ZOFRAN) 4 MG tablet Take 1 tablet (4 mg total) by mouth every 6 (six) hours. 09/19/12   Ezequiel Essex, MD   BP 147/69  Pulse 88  Temp(Src) 98.6 F (37 C) (Oral)  Resp 12  SpO2 99% Physical Exam  Constitutional:  Thin, elderly woman in wheelchair.  No apparent distress.  HENT:  Head: Normocephalic and atraumatic.  Nose: Nose normal.  Mouth/Throat: Oropharynx is clear and moist. No oropharyngeal exudate.  Eyes:  Conjunctivae are normal. Pupils are equal, round, and reactive to light. Right eye exhibits no discharge. Left eye exhibits no discharge.  Neck: Normal range of motion. Neck supple.  Cardiovascular: Normal rate, regular rhythm and normal heart sounds.   No murmur heard. Pulmonary/Chest: Effort normal and breath sounds normal. No respiratory distress. She has no wheezes. She has no rales.  Abdominal: Soft. She exhibits no distension and no mass. Bowel sounds  are increased. There is no tenderness. There is no rebound and no guarding.  Musculoskeletal: She exhibits no edema and no tenderness.  Neurological: She is alert.  Skin: Skin is warm and dry. No rash noted.    ED Course  Procedures (including critical care time) Labs Review Labs Reviewed  GLUCOSE, CAPILLARY - Abnormal; Notable for the following:    Glucose-Capillary 184 (*)    All other components within normal limits    Imaging Review No results found.   MDM   1. Other fatigue   2. Dysphagia   3. Confusion    80 year old woman with hypertension and diabetes who presented with her family for fatigue and increased confusion. She is unable to walk due to her fatigue. She normally walks well. CBG here was 184. There is also report of some abdominal pain, possible confusion, dysphasia. There are are no obvious signs of infection or electrolyte derangements. Given her generalized weakness and her age, I discussed extensively with her family that the safest thing would be to have her evaluated in the emergency room, especially as she does not have a primary care physician. Family is agreeable with this plan, and we will transfer her to the emergency room for workup and evaluation.    Melony Overly, MD 01/11/14 1356

## 2014-01-11 NOTE — ED Notes (Signed)
CG-4 reported to [employer]

## 2014-01-11 NOTE — ED Provider Notes (Signed)
Medical screening examination/treatment/procedure(s) were performed by non-physician practitioner and as supervising physician I was immediately available for consultation/collaboration.    Dot Lanes, MD 01/11/14 2112

## 2014-01-12 ENCOUNTER — Encounter (HOSPITAL_COMMUNITY): Payer: Self-pay | Admitting: Physician Assistant

## 2014-01-12 DIAGNOSIS — K8051 Calculus of bile duct without cholangitis or cholecystitis with obstruction: Secondary | ICD-10-CM

## 2014-01-12 DIAGNOSIS — R932 Abnormal findings on diagnostic imaging of liver and biliary tract: Secondary | ICD-10-CM

## 2014-01-12 DIAGNOSIS — R112 Nausea with vomiting, unspecified: Secondary | ICD-10-CM

## 2014-01-12 DIAGNOSIS — R109 Unspecified abdominal pain: Secondary | ICD-10-CM

## 2014-01-12 HISTORY — DX: Calculus of bile duct without cholangitis or cholecystitis with obstruction: K80.51

## 2014-01-12 LAB — URINALYSIS, ROUTINE W REFLEX MICROSCOPIC
Bilirubin Urine: NEGATIVE
GLUCOSE, UA: NEGATIVE mg/dL
Hgb urine dipstick: NEGATIVE
KETONES UR: NEGATIVE mg/dL
NITRITE: NEGATIVE
PROTEIN: NEGATIVE mg/dL
Specific Gravity, Urine: 1.012 (ref 1.005–1.030)
UROBILINOGEN UA: 1 mg/dL (ref 0.0–1.0)
pH: 5.5 (ref 5.0–8.0)

## 2014-01-12 LAB — URINE MICROSCOPIC-ADD ON

## 2014-01-12 LAB — GLUCOSE, CAPILLARY
GLUCOSE-CAPILLARY: 115 mg/dL — AB (ref 70–99)
GLUCOSE-CAPILLARY: 88 mg/dL (ref 70–99)
GLUCOSE-CAPILLARY: 77 mg/dL (ref 70–99)
Glucose-Capillary: 111 mg/dL — ABNORMAL HIGH (ref 70–99)
Glucose-Capillary: 121 mg/dL — ABNORMAL HIGH (ref 70–99)

## 2014-01-12 LAB — COMPREHENSIVE METABOLIC PANEL
ALT: 139 U/L — AB (ref 0–35)
ANION GAP: 13 (ref 5–15)
AST: 234 U/L — ABNORMAL HIGH (ref 0–37)
Albumin: 2.5 g/dL — ABNORMAL LOW (ref 3.5–5.2)
Alkaline Phosphatase: 217 U/L — ABNORMAL HIGH (ref 39–117)
BUN: 19 mg/dL (ref 6–23)
CALCIUM: 9 mg/dL (ref 8.4–10.5)
CO2: 21 mEq/L (ref 19–32)
CREATININE: 1.53 mg/dL — AB (ref 0.50–1.10)
Chloride: 103 mEq/L (ref 96–112)
GFR, EST AFRICAN AMERICAN: 34 mL/min — AB (ref >90)
GFR, EST NON AFRICAN AMERICAN: 29 mL/min — AB (ref >90)
GLUCOSE: 120 mg/dL — AB (ref 70–99)
Potassium: 4.3 mEq/L (ref 3.7–5.3)
Sodium: 137 mEq/L (ref 137–147)
TOTAL PROTEIN: 6.3 g/dL (ref 6.0–8.3)
Total Bilirubin: 1.2 mg/dL (ref 0.3–1.2)

## 2014-01-12 LAB — CBC
HEMATOCRIT: 37.5 % (ref 36.0–46.0)
HEMOGLOBIN: 12 g/dL (ref 12.0–15.0)
MCH: 30 pg (ref 26.0–34.0)
MCHC: 32 g/dL (ref 30.0–36.0)
MCV: 93.8 fL (ref 78.0–100.0)
Platelets: 146 10*3/uL — ABNORMAL LOW (ref 150–400)
RBC: 4 MIL/uL (ref 3.87–5.11)
RDW: 16.3 % — ABNORMAL HIGH (ref 11.5–15.5)
WBC: 7.1 10*3/uL (ref 4.0–10.5)

## 2014-01-12 LAB — PROTIME-INR
INR: 1.15 (ref 0.00–1.49)
Prothrombin Time: 14.7 seconds (ref 11.6–15.2)

## 2014-01-12 MED ORDER — BOOST / RESOURCE BREEZE PO LIQD
1.0000 | Freq: Every day | ORAL | Status: DC
  Administered 2014-01-12 – 2014-01-16 (×3): 1 via ORAL

## 2014-01-12 MED ORDER — LABETALOL HCL 5 MG/ML IV SOLN
5.0000 mg | INTRAVENOUS | Status: DC | PRN
  Administered 2014-01-12 – 2014-01-15 (×2): 5 mg via INTRAVENOUS
  Filled 2014-01-12 (×4): qty 4

## 2014-01-12 NOTE — Progress Notes (Signed)
INITIAL NUTRITION ASSESSMENT  Pt meets criteria for severe MALNUTRITION in the context of chronic illness as evidenced by severe muscle wasting and subcutaneous fat loss throughout body.  DOCUMENTATION CODES Per approved criteria  -Severe malnutrition in the context of chronic illness -Underweight   INTERVENTION: - Diet advancement per MD - Resource Breeze daily - Recommend monitoring of potassium, magnesium, and phosphorus as diet advances and pt taking in PO, as pt at high refeeding risk - Multivitamin 1 tablet PO daily  - RD to monitor plan of care   NUTRITION DIAGNOSIS: Inadequate oral intake related to clear liquid diet as evidenced by diet order.   Goal: Advance diet as tolerated to diabetic diet  Monitor:  Weights, labs, diet advancement  Reason for Assessment: Malnutrition screening tool   80 y.o. female  Admitting Dx: Cholecystitis, chronic  ASSESSMENT: Pt with PMH of DM2, HTN, appendectomy, presents to ED with son with c/o abdominal pain and lethargy. Per GI, plan is to R/O cholecystitis, R/O ulcer, GERD, esophageal stricture. HIDA today, hepatitis serologies, and EGD possible dilation tomorrow.  - Talked to pt with interpreter present - Pt has coffee and beans/tortillas for breakfast at home and part of a can of spinach for dinner at 5pm - Pt does not eat meat - Family in room suspects pt has been losing weight, unsure how much - Pt c/o having nausea/vomiting after eating for the past 3 months - Pt visibly cachetic at 86 pounds  Alk phos, AST/ALT elevated    Height: Ht Readings from Last 1 Encounters:  01/11/14 5' (1.524 m)    Weight: Wt Readings from Last 1 Encounters:  01/11/14 86 lb 13.8 oz (39.4 kg)    Ideal Body Weight: 100 lbs  % Ideal Body Weight: 86%  Wt Readings from Last 10 Encounters:  01/11/14 86 lb 13.8 oz (39.4 kg)  09/19/12 99 lb 3.3 oz (45 kg)    Usual Body Weight: 99 lbs last year  % Usual Body Weight: 87%  BMI:  Body mass  index is 16.96 kg/(m^2). Underweight  Estimated Nutritional Needs: Kcal: 1200-1400 Protein: 60-75g Fluid: 1.2-1.4L/day   Skin: intact  Diet Order: Clear Liquid  EDUCATION NEEDS: -No education needs identified at this time   Intake/Output Summary (Last 24 hours) at 01/12/14 1520 Last data filed at 01/12/14 1314  Gross per 24 hour  Intake 1090.83 ml  Output    300 ml  Net 790.83 ml    Last BM: intact  Labs:   Recent Labs Lab 01/11/14 1713 01/12/14 0426  NA 138 137  K 4.7 4.3  CL 100 103  CO2 24 21  BUN 18 19  CREATININE 1.55* 1.53*  CALCIUM 9.2 9.0  GLUCOSE 160* 120*    CBG (last 3)   Recent Labs  01/12/14 0430 01/12/14 0813 01/12/14 1224  GLUCAP 115* 111* 121*    Scheduled Meds: . ciprofloxacin  400 mg Intravenous Q12H  . heparin  5,000 Units Subcutaneous 3 times per day  . insulin aspart  0-9 Units Subcutaneous 6 times per day  . metronidazole  500 mg Intravenous Q8H    Continuous Infusions: . sodium chloride 75 mL/hr at 01/12/14 0037    Past Medical History  Diagnosis Date  . Hypertension   . Diabetes mellitus     Past Surgical History  Procedure Laterality Date  . Kidney stone surgery    . Cataract extraction Bilateral     Carlis Stable MS, RD, LDN [phone] Pager [phone] Weekend/After Hours Pager

## 2014-01-12 NOTE — Consult Note (Addendum)
[facility] Consult: 1:36 PM 01/12/2014  LOS: 1 day    Referring Provider: Dr Hurley Cisco of [facility]:  A clinic on [address] provides her care.  Primary Gastroenterologist:  Althia Forts.    Reason for Consultation:  Abdominal pain and abnormal CT scan of liver, biliary ducts, GB   HPI: Tammy Willis is a 80 y.o. female. PMH of DM2, HTN, appendectomy, extraction of kidney stones.  Originally from [place], living in [place] for ~ 6 years.   In recent months having post prandial N/V intermittently for at least 6 months.  Never referred by PMD for further testing. Takes Ibuprofen infrequently. Pt has told dtr that there is occasional blood in the emesis and dark stools.  Developed abdominal pain and lethargy in last 3 days.  Has not gotten up from bed.   Her LFTs are elevated   Ultrsound shows GB sludge, distended GB: equivocal for cholecystitis (GB wall thick on CT of 2012 too).  Intra and extra hepatic ducts are mildly dilated, CBD measurement not dictated.  Liver appears cirrhotic.   Background of stable memory loss, family does not feel it is acutely worse.  She is more lethargic.  Before a few days ago was able to walk independently within the house.  No history of etoh abuse or of liver issues.   Past Medical History  Diagnosis Date  . Hypertension   . Diabetes mellitus     Past Surgical History  Procedure Laterality Date  . Kidney stone surgery    . Cataract extraction Bilateral     Prior to Admission medications   Medication Sig Start Date End Date Taking? Authorizing Provider  glimepiride (AMARYL) 4 MG tablet Take 4 mg by mouth daily before breakfast.     Yes Historical Provider, MD  ibuprofen (ADVIL,MOTRIN) 100 MG/5ML suspension Take 200 mg by mouth daily as needed  for fever or mild pain.   Yes Historical Provider, MD  lisinopril-hydrochlorothiazide (PRINZIDE,ZESTORETIC) 20-25 MG per tablet Take 1 tablet by mouth daily.     Yes Historical Provider, MD  metFORMIN (GLUCOPHAGE) 1000 MG tablet Take 1,000 mg by mouth 2 (two) times daily with a meal.     Yes Historical Provider, MD  Multiple Vitamins-Minerals (MULTIVITAMINS THER. W/MINERALS) TABS Take 1 tablet by mouth daily. Centrum silver    Yes Historical Provider, MD    Scheduled Meds: . ciprofloxacin  400 mg Intravenous Q12H  . heparin  5,000 Units Subcutaneous 3 times per day  . insulin aspart  0-9 Units Subcutaneous 6 times per day  . metronidazole  500 mg Intravenous Q8H   Infusions: . sodium chloride 75 mL/hr at 01/12/14 0955   PRN Meds: gi cocktail, labetalol   Allergies as of 01/11/2014  . (No Known Allergies)    History reviewed. No pertinent family history.  History   Social History  . Marital Status: Single    Spouse Name: N/A    Number of Children: N/A  . Years of Education: N/A   Occupational History  . Not on file.  Social History Main Topics  . Smoking status: Never Smoker   . Smokeless tobacco: Not on file  . Alcohol Use: No  . Drug Use: No  . Sexual Activity: No   Other Topics Concern  . Not on file   Social History Narrative  . No narrative on file    REVIEW OF SYSTEMS: Constitutional:  Always thin, family unclear about weight loss ENT:  No nose bleeds.  Poor dental condition makes chewing food difficult. Pulm:  No dyspnea CV:  No palpitations, no LE edema.  GU:  No hematuria, no frequency GI:  Per HPI Heme:  No issues with excessive bleeding   Transfusions:  None ever Neuro:  No headaches, no peripheral tingling or numbness Derm: rash across bridge of nose that is pruritic.  Endocrine:  No sweats or chills.  No polyuria or dysuria Immunization:  Not queried.  Travel:  None beyond local counties in last few months.    PHYSICAL EXAM: Vital signs  in last 24 hours: Filed Vitals:   01/12/14 0955  BP: 155/69  Pulse: 55  Temp: 98.4 F (36.9 C)  Resp: 18   Wt Readings from Last 3 Encounters:  01/11/14 39.4 kg (86 lb 13.8 oz)  09/19/12 45 kg (99 lb 3.3 oz)    General: thin, aged, frail elderly woman Head:  No asymmetry or   Eyes:  No icterus or pallor Ears:  Not HOH  Nose:  No discharge Mouth:  Clear, moist, tongue midline.  Poor teeth, bul of them gone. Neck:  No mass, TMG or bruit Lungs:  Clear bil.  Good BS, no dyspnea or cough Heart: Irreg, Irreg, rate nont accelerated or slow. No MRG Abdomen:  Soft, ND.  No HSM , no mass, no bruits.  Tender in RUQ (mild).   Rectal: deferred   Musc/Skeltl: no joint swelling, +kyphosis Extremities:  No CCE  Neurologic:  Oriented to self and "hospital"  Not to town or year.  No tremor, limb weakness or asterixis.  Skin:  No jaundice, no sores Tattoos:  none Nodes:  No cervical adenopathy.    Psych:  Lethargic, cooperative, arouseable.  Not agitated.   Intake/Output from previous day: 07/20 0701 - 07/21 0700 In: 1090.8 [I.V.:690.8; IV Piggyback:400] Out: 50 [Urine:50] Intake/Output this shift: Total I/O In: 0  Out: 250 [Urine:250]  LAB RESULTS:  Recent Labs  01/11/14 1713 01/12/14 0426  WBC 10.7* 7.1  HGB 13.0 12.0  HCT 40.3 37.5  PLT 178 146*   BMET Lab Results  Component Value Date   NA 137 01/12/2014   NA 138 01/11/2014   NA 139 08/25/2011   K 4.3 01/12/2014   K 4.7 01/11/2014   K 3.5 08/25/2011   CL 103 01/12/2014   CL 100 01/11/2014   CL 102 08/25/2011   CO2 21 01/12/2014   CO2 24 01/11/2014   CO2 25 08/25/2011   GLUCOSE 120* 01/12/2014   GLUCOSE 160* 01/11/2014   GLUCOSE 207* 08/25/2011   BUN 19 01/12/2014   BUN 18 01/11/2014   BUN 20 08/25/2011   CREATININE 1.53* 01/12/2014   CREATININE 1.55* 01/11/2014   CREATININE 1.06 08/25/2011   CALCIUM 9.0 01/12/2014   CALCIUM 9.2 01/11/2014   CALCIUM 10.2 08/25/2011   LFT  Recent Labs  01/11/14 1713 01/12/14 0426  PROT 7.4 6.3    ALBUMIN 3.0* 2.5*  AST 458* 234*  ALT 199* 139*  ALKPHOS 277* 217*  BILITOT 1.6* 1.2   PT/INR No results found for  this basename: INR,  PROTIME   Hepatitis Panel No results found for this basename: HEPBSAG, HCVAB, HEPAIGM, HEPBIGM,  in the last 72 hours Lipase     Component Value Date/Time   LIPASE 15 01/11/2014 1713    Drugs of Abuse  No results found for this basename: labopia,  cocainscrnur,  labbenz,  amphetmu,  thcu,  labbarb     RADIOLOGY STUDIES: Ct Head Wo Contrast 01/11/2014   COMPARISON:  08/2012  FINDINGS: There is no evidence of acute cortical infarct, intracranial hemorrhage, mass, midline shift, or extra-axial fluid collection. Moderate cerebral atrophy is unchanged. Patchy and confluent hypodensities in the subcortical and periventricular white matter are similar to the prior study and compatible with moderate chronic small vessel ischemic disease.  Prior left cataract extraction is noted. Mastoid air cells and visualized paranasal sinuses are clear. Mild carotid siphon calcification is noted bilaterally.  IMPRESSION: No evidence of acute intracranial abnormality. Moderate chronic small vessel ischemic disease.   Electronically Signed   By: Logan Bores   On: 01/11/2014 18:55   US Abdomen Complete 01/11/2014     COMPARISON:  CT of the abdomen and pelvis without contrast 01/09/2011.  FINDINGS: Gallbladder:  There is some echogenic material lying dependently within the gallbladder which does not shadow, compatible with tumefactive sludge. Gallbladder appears moderately distended. Gallbladder wall is thickened at 6 mm. However, there is no pericholecystic fluid, and per report, the patient did not exhibit a sonographic Murphy's sign on examination.  Common bile duct:  Diameter: 8.5 mm in the porta hepatis.  Liver:  Hepatic echotexture is heterogeneous with multifocal areas of increased echogenicity and slightly nodular contour, suggestive of underlying cirrhosis. Mild prominence  of intrahepatic bile ducts.  IVC:  No abnormality visualized.  Pancreas:  Unremarkable.  Spleen:  Size and appearance within normal limits.  6.6 cm in length.  Right Kidney:  Length: 9.2 cm. Echogenicity within normal limits. 3.1 x 1.8 x 1.2 cm anechoic lesion in the parapelvic aspect of the lower pole of the right kidney, compatible with parapelvic cyst. No hydronephrosis visualized.  Left Kidney:  Length: 10.3 cm. Echogenicity within normal limits. In the interpolar region there is a 1.7 x 1.4 x 1.3 cm lesion that is generally anechoic with increased through transmission, but contains a thin internal septation, most compatible with a mildly complex cyst. No hydronephrosis visualized.  Abdominal aorta:  No aneurysm visualized.  Multiple atherosclerotic calcifications.  Other findings:  None.  IMPRESSION: 1. Tumefactive sludge in the gallbladder. The gallbladder wall is mildly thickened, and the gallbladder is moderately distended, however, per report, the patient did not exhibit a sonographic Murphy's sign on examination. Overall, findings are equivocal for acute cholecystitis and clinical correlation is recommended. It is worth noting, however, that the gallbladder wall appeared markedly thickened on remote prior CT examination 01/09/2011, which suggests at this may be chronic in this individual. 2. Mild intra and extrahepatic biliary ductal dilatation. The possibility of sludge in the common bile duct should be considered. This could be further evaluated with MRCP if clinically appropriate. 3. Nodular liver contour and heterogeneous echotexture of the liver parenchyma, suggestive of cirrhosis. 4. Additional findings, as above.   Electronically Signed   By: Vinnie Langton M.D.   On: 01/11/2014 20:08   Dg Abd Acute W/chest 01/11/2014   CLINICAL DATA:  Abdominal pain.  EXAM: ACUTE ABDOMEN SERIES (ABDOMEN 2 VIEW & CHEST 1 VIEW)  COMPARISON:  Chest x-ray 12/10/2011  FINDINGS: Heart is normal size. Tortuous,  calcified aorta. Lungs are clear. No effusions.  Gas within nondistended large and small bowel. No evidence of free air, organomegaly or suspicious calcification. Moderate stool burden in the colon.  No acute bony abnormality.  IMPRESSION: No evidence of bowel obstruction or free air. Moderate stool burden.  No acute cardiopulmonary disease.   Electronically Signed   By: Rolm Baptise M.D.   On: 01/11/2014 19:08    ENDOSCOPIC STUDIES: None ever  IMPRESSION:   *  Abdominal pain. R/O cholecystitis. GB sludge, mild ductal dilatation on imaging. LFTs abnormal.  Rule out cholecystitis  *  Chronic intermittent "vomiting".  On detailed history this sounds more like dysphagia and/or regurgitation.  Some dark stool and some blood in emesis reported. Rule out ulcers, rule out varices (none on CT and no Portal htn on CT)  *  Cirrhotic looking liver on ultrasound. Does not drink ETOH  * Stable memory loss and confusion.  Ischemic small vessel cerebrovascular dz on CT  *  Advanced age.   *  Hypertension. systolics readings in 093G and diastolics to 18E.     PLAN:     *  HIDA scan today *  EGD with possible dilatation  at 11AM tomorrow.  Discussed with dtr and son-in law who are agreeable.  Translator present for interview.  *  Hepatitis serologies. Coags   Azucena Freed  01/12/2014, 1:36 PM Pager: [phone]      Attending physician's note   I have taken a history, examined the patient and reviewed the chart. I agree with the Advanced Practitioner's note, impression and recommendations. R/O cholecystitis, CBD stone. R/O ulcer, GERD, esophageal stricture. HIDA today. Hepatitis serologies. EGD possible dilation tomorrow.   Ladene Artist, MD [facility]

## 2014-01-12 NOTE — Progress Notes (Addendum)
[facility] Progress Note   Tammy Willis QMG:[id number] DOB: 31-Jan-1925 DOA: 01/11/2014 PCP: Pcp Not In System  Brief narrative: Tammy Willis is a 80 y.o. female  with type 2 diabetes mellitus, hypertension who presents with abdominal pain. She's been having pain after eating for a couple of months now. She decided to come to the hospital due to significant weakness. In the ER she was found to have an ultrasound which was negative for an acute cholecystitis. LFTs are elevated-  alkaline phosphatase is 217. She also complains of feeling like food is getting stuck in her throat and she's been inducing vomiting. Family and patient are unable to tell if she's had any weight loss. There is clothes and and is  Principal Problem:   Abdominal pain, other specified site--  Nausea with vomiting-dysphagia-- sludge in gallbladder -Difficult to tell if this is related to a chronic cholecystitis versus gastritis or PUD -GI has evaluated her and have ordered a HIDA scan for today and plan on doing an EGD tomorrow -SLP has evaluated her and final problem with pharyngeal part of swallowing -Will have clear liquids after HIDA scan as ordered by GI  -She is also on Cipro and Flagyl which I will discontinue for now -  Active Problems: Cirrhosis of the liver? Ultrasound reveals nodular contour and heterogeneous areas of increased echogenicity -Hepatitis panel ordered by GI  CKD 3 versus acute renal failure -Last BUN and creatinine that we have is from March 2013 when BUN was 20 and creatinine 1.06 -She she is receiving normal saline at 75 cc an hour-follow creatinine with hydration    Diabetes mellitus -Low dose sliding scale insulin-hold metformin    HTN (hypertension) -Hold lisinopril/HCTZ-can give labetalol when necessary for hypertension   Mild Thrombocytopenia - recheck in AM  Elevated lactic acid - this was mild- cont to hydrate    Code Status: full code  Family  Communication: with daughter and son-in-law  Disposition Plan: to be determined   Consultants:  GI   Procedures:  none  Antibiotics: Anti-infectives   Start     Dose/Rate Route Frequency Ordered Stop   01/11/14 2115  ciprofloxacin (CIPRO) IVPB 400 mg     400 mg 200 mL/hr over 60 Minutes Intravenous Every 12 hours 01/11/14 2102     01/11/14 2115  metroNIDAZOLE (FLAGYL) IVPB 500 mg     500 mg 100 mL/hr over 60 Minutes Intravenous Every 8 hours 01/11/14 2102         DVT prophylaxis:  heparin   Objective: Filed Weights   01/11/14 2319  Weight: 39.4 kg (86 lb 13.8 oz)    Intake/Output Summary (Last 24 hours) at 01/12/14 1531 Last data filed at 01/12/14 1314  Gross per 24 hour  Intake 1090.83 ml  Output    300 ml  Net 790.83 ml     Vitals Filed Vitals:   01/11/14 2319 01/12/14 0434 01/12/14 0450 01/12/14 0955  BP: 175/78 172/93  155/69  Pulse: 65 30 57 55  Temp: 98.2 F (36.8 C) 98.2 F (36.8 C)  98.4 F (36.9 C)  TempSrc: Oral Oral  Oral  Resp: 20 18  18   Height: 5' (1.524 m)     Weight: 39.4 kg (86 lb 13.8 oz)     SpO2: 96% 97%      Exam: General: No acute respiratory distress-awake alert oriented x3 Lungs: Clear to auscultation bilaterally without wheezes or crackles Cardiovascular: Regular rate and rhythm without murmur gallop or rub normal S1  and S2 Abdomen: mildly tender in epigastrium , nondistended, soft, bowel sounds positive, no rebound, no ascites, no appreciable mass Extremities: No significant cyanosis, clubbing, or edema bilateral lower extremities  Data Reviewed: Basic Metabolic Panel:  Recent Labs Lab 01/11/14 1713 01/12/14 0426  NA 138 137  K 4.7 4.3  CL 100 103  CO2 24 21  GLUCOSE 160* 120*  BUN 18 19  CREATININE 1.55* 1.53*  CALCIUM 9.2 9.0   Liver Function Tests:  Recent Labs Lab 01/11/14 1713 01/12/14 0426  AST 458* 234*  ALT 199* 139*  ALKPHOS 277* 217*  BILITOT 1.6* 1.2  PROT 7.4 6.3  ALBUMIN 3.0* 2.5*     Recent Labs Lab 01/11/14 1713  LIPASE 15   No results found for this basename: AMMONIA,  in the last 168 hours CBC:  Recent Labs Lab 01/11/14 1713 01/12/14 0426  WBC 10.7* 7.1  NEUTROABS 9.8*  --   HGB 13.0 12.0  HCT 40.3 37.5  MCV 91.4 93.8  PLT 178 146*   Cardiac Enzymes: No results found for this basename: CKTOTAL, CKMB, CKMBINDEX, TROPONINI,  in the last 168 hours BNP (last 3 results) No results found for this basename: PROBNP,  in the last 8760 hours CBG:  Recent Labs Lab 01/11/14 1324 01/11/14 2305 01/12/14 0430 01/12/14 0813 01/12/14 1224  GLUCAP 184* 168* 115* 111* 121*    No results found for this or any previous visit (from the past 240 hour(s)).   Studies:  Recent x-ray studies have been reviewed in detail by the Attending Physician  Scheduled Meds:  Scheduled Meds: . ciprofloxacin  400 mg Intravenous Q12H  . heparin  5,000 Units Subcutaneous 3 times per day  . insulin aspart  0-9 Units Subcutaneous 6 times per day  . metronidazole  500 mg Intravenous Q8H   Continuous Infusions: . sodium chloride 75 mL/hr at 01/12/14 0955    Time spent on care of this patient: >35 min   [place], MD 01/12/2014, 3:31 PM  LOS: 1 day   [facility] Office  [phone] Pager - Text Page per [email or web address]  If 7PM-7AM, please contact night-coverage [email or web address]

## 2014-01-12 NOTE — Progress Notes (Signed)
Utilization review completed.  

## 2014-01-12 NOTE — Evaluation (Signed)
Physical Therapy Evaluation Patient Details Name: Tammy Willis MRN: [id number] DOB: Sep 29, 1924 Today's Date: 01/12/2014   History of Present Illness  Tammy Willis is a 80 y.o. female with PMH of DM2, HTN, appendectomy, presents to ED with son (who interprets for patient as she speaks spanish only) with c/o abdominal pain and lethargy. Yesterday patient complained of abdominal pain. No fever, does report N/V with "every meal she eats" for the past 2-3 months or so. Also has worse symptoms with spicy foods. Pain improved after onset yesterday but today patient feels much more weak, off balance, and having difficulty walking at home. Patient does typically walk but needs help with ADLs, family suspects she may have some dementia (although not formally diagnosed) but mental status was worse this morning (described as "talking to dead relatives").   Clinical Impression   Pt admitted with above. Pt currently with functional limitations due to the deficits listed below (see PT Problem List).  Pt will benefit from skilled PT to increase their independence and safety with mobility to allow discharge to the venue listed below.       Follow Up Recommendations Home health PT;Supervision/Assistance - 24 hour    Equipment Recommendations  Rolling walker with 5" wheels;3in1 (PT)    Recommendations for Other Services       Precautions / Restrictions Precautions Precautions: Fall      Mobility  Bed Mobility Overal bed mobility: Needs Assistance Bed Mobility: Supine to Sit     Supine to sit: Min assist     General bed mobility comments: min handheld assist to pull to sit  Transfers Overall transfer level: Needs assistance Equipment used: Rolling walker (2 wheeled) Transfers: Sit to/from Stand Sit to Stand: Min assist         General transfer comment: min assist to rise  Ambulation/Gait Ambulation/Gait assistance: Min guard Ambulation Distance (Feet): 15  Feet Assistive device: Rolling walker (2 wheeled) Gait Pattern/deviations: Decreased stride length;Decreased step length - right;Decreased step length - left     General Gait Details: Cues to keep body close to [employer] Mobility    Modified Rankin (Stroke Patients Only)       Balance           Standing balance support: Bilateral upper extremity supported Standing balance-Leahy Scale: Poor                               Pertinent Vitals/Pain Denies pain or dizziness with getting up and walking    Home Living Family/patient expects to be discharged to:: Private residence Living Arrangements: Children Available Help at Discharge: Family;Available 24 hours/day Type of Home: Apartment Home Access: Stairs to enter Entrance Stairs-Rails: Right Entrance Stairs-Number of Steps: 12-15 Home Layout: One level Home Equipment: Cane - single point      Prior Function Level of Independence: Independent with assistive device(s)               Hand Dominance        Extremity/Trunk Assessment   Upper Extremity Assessment: Overall WFL for tasks assessed           Lower Extremity Assessment: Generalized weakness         Communication   Communication: Prefers language other than English;Interpreter utilized  Cognition Arousal/Alertness: Awake/alert Behavior During Therapy: WFL for tasks assessed/performed Overall Cognitive Status: Within Functional Limits for tasks  assessed                      General Comments      Exercises        Assessment/Plan    PT Assessment Patient needs continued PT services  PT Diagnosis Difficulty walking;Generalized weakness   PT Problem List Decreased strength  PT Treatment Interventions DME instruction;Gait training;Stair training;Functional mobility training;Therapeutic activities;Therapeutic exercise;Balance training;Patient/family education   PT Goals (Current goals  can be found in the Care Plan section) Acute Rehab PT Goals Patient Stated Goal: did not state; agreeable to amb, but doesn't want blood draws PT Goal Formulation: With patient Time For Goal Achievement: 01/26/14 Potential to Achieve Goals: Good    Frequency Min 3X/week   Barriers to discharge        Co-evaluation               End of Session   Activity Tolerance: Patient tolerated treatment well Patient left: in bed;with call bell/phone within reach;Other (comment) (speaking with dietician through interpreter) Nurse Communication: Mobility status         Time: 5397-6734 PT Time Calculation (min): 29 min   Charges:   PT Evaluation $Initial PT Evaluation Tier I: 1 Procedure PT Treatments $Gait Training: 8-22 mins $Therapeutic Activity: 8-22 mins   PT G Codes:          Quin Hoop 01/12/2014, 4:32 PM  [place], [place]  Acute Rehabilitation Services Pager [phone] Office [phone]

## 2014-01-12 NOTE — Evaluation (Addendum)
Clinical/Bedside Swallow Evaluation Patient Details  Name: Tammy Willis MRN: [id number] Date of Birth: [birth year]  Today's Date: 01/12/2014 Time: 1030-1054 SLP Time Calculation (min): 24 min  Past Medical History:  Past Medical History  Diagnosis Date  . Hypertension   . Diabetes mellitus    Past Surgical History:  Past Surgical History  Procedure Laterality Date  . Kidney stone surgery     HPI:  Tammy Willis is a 80 y.o. female with PMH of DM2, HTN, appendectomy, presents to ED with son (who interprets for patient as she speaks spanish only) with c/o abdominal pain and lethargy. Yesterday patient complained of abdominal pain. No fever, does report N/V with "every meal she eats" for the past 2-3 months or so. Also has worse symptoms with spicy foods. Pain improved after onset yesterday but today patient feels much more weak, off balance, and having difficulty walking at home. Patient does typically walk but needs help with ADLs, family suspects she may have some dementia (although not formally diagnosed) but mental status was worse this morning (described as "talking to dead relatives").    Assessment / Plan / Recommendation Clinical Impression  Pt demonstrates no evidence of aspiration with liquids or solids. She is missing dentition and reports difficulty masticating most foods, but demosntrates ability to masticate crackers. She does have numerous complaints of upset stomach with all foods except tortillas, beans, coffee and broth. She reports globus and regurgitation and oten makes herself gag to either regurgitate or vomit foods. Suspect that dysphagia is primarily esophageal or due to some other GI issue. Also of note, pt and family report that PO intake is so poor that it has made use of insulin for diabetes confusing to them. Recommend consideration for an esophagram or consult to GI. SLP will f/u for tolerance of meals and esophageal precautions depending on  results of work up. Recommend pt could be allowed to choose foods from a regular texture diet and thin liquids with pills crushed, but MD reports she needs a fat free liquid diet for now. Also recommend referral to PT.     Aspiration Risk  Mild    Diet Recommendation Regular;Thin liquid   Liquid Administration via: Cup;Straw Medication Administration: Crushed with puree Supervision: Patient able to self feed Compensations: Slow rate;Small sips/bites;Follow solids with liquid Postural Changes and/or Swallow Maneuvers: Seated upright 90 degrees    Other  Recommendations Recommended Consults: Consider GI evaluation;Consider esophageal assessment Oral Care Recommendations: Oral care BID   Follow Up Recommendations  None    Frequency and Duration min 2x/week  2 weeks   Pertinent Vitals/Pain NA    SLP Swallow Goals     Swallow Study Prior Functional Status       General HPI: Tammy Willis is a 80 y.o. female with PMH of DM2, HTN, appendectomy, presents to ED with son (who interprets for patient as she speaks spanish only) with c/o abdominal pain and lethargy. Yesterday patient complained of abdominal pain. No fever, does report N/V with "every meal she eats" for the past 2-3 months or so. Also has worse symptoms with spicy foods. Pain improved after onset yesterday but today patient feels much more weak, off balance, and having difficulty walking at home. Patient does typically walk but needs help with ADLs, family suspects she may have some dementia (although not formally diagnosed) but mental status was worse this morning (described as "talking to dead relatives").  Type of Study: Bedside swallow evaluation Diet Prior to this Study: NPO Temperature  Spikes Noted: No Respiratory Status: Room air History of Recent Intubation: No Behavior/Cognition: Alert;Cooperative;Pleasant mood Oral Cavity - Dentition: Missing dentition Self-Feeding Abilities: Able to feed self Patient  Positioning: Upright in bed Baseline Vocal Quality: Clear Volitional Cough: Strong Volitional Swallow: Able to elicit    Oral/Motor/Sensory Function Overall Oral Motor/Sensory Function: Appears within functional limits for tasks assessed   Ice Chips     Thin Liquid Thin Liquid: Within functional limits Presentation: Cup;Self Fed    Nectar Thick Nectar Thick Liquid: Not tested   Honey Thick Honey Thick Liquid: Not tested   Puree Puree: Impaired Presentation: Spoon Oral Phase Impairments: Other (comment) (pt refused to swallow, spit out bolus)   Solid   GO    Solid: Within functional limits      Herbie Baltimore, MA CCC-SLP [phone]  Tammy Willis, Tammy Willis 01/12/2014,10:58 AM

## 2014-01-12 NOTE — Progress Notes (Signed)
01/11/14 23:30 Received patient from ED,admitted due to abdominal pain and weakness.Patient only speaks Spanish,health history and assessment obtained via interpreter.Patient (via interpreter) allows information regarding her medical history/care to be released/discussed with her daughter and son in law.Patient placed on falls precaution,call bell placed within reach. Khamiya Varin, Wonda Cheng, Therapist, sports

## 2014-01-13 ENCOUNTER — Encounter (HOSPITAL_COMMUNITY)
Admission: EM | Disposition: A | Discharge status: Home Health | Payer: Self-pay | Source: Home / Self Care | Attending: Internal Medicine

## 2014-01-13 ENCOUNTER — Encounter (HOSPITAL_COMMUNITY): Payer: Self-pay

## 2014-01-13 ENCOUNTER — Inpatient Hospital Stay (HOSPITAL_COMMUNITY): Payer: Medicaid Other | Admitting: Anesthesiology

## 2014-01-13 ENCOUNTER — Inpatient Hospital Stay (HOSPITAL_COMMUNITY): Payer: Medicaid Other

## 2014-01-13 ENCOUNTER — Encounter (HOSPITAL_COMMUNITY): Payer: Medicaid Other | Admitting: Anesthesiology

## 2014-01-13 DIAGNOSIS — R1033 Periumbilical pain: Secondary | ICD-10-CM

## 2014-01-13 DIAGNOSIS — E43 Unspecified severe protein-calorie malnutrition: Secondary | ICD-10-CM | POA: Diagnosis present

## 2014-01-13 HISTORY — PX: ESOPHAGOGASTRODUODENOSCOPY: SHX5428

## 2014-01-13 LAB — COMPREHENSIVE METABOLIC PANEL
ALT: 68 U/L — ABNORMAL HIGH (ref 0–35)
ANION GAP: 11 (ref 5–15)
AST: 72 U/L — ABNORMAL HIGH (ref 0–37)
Albumin: 2.1 g/dL — ABNORMAL LOW (ref 3.5–5.2)
Alkaline Phosphatase: 156 U/L — ABNORMAL HIGH (ref 39–117)
BUN: 18 mg/dL (ref 6–23)
CHLORIDE: 109 meq/L (ref 96–112)
CO2: 23 mEq/L (ref 19–32)
CREATININE: 1.61 mg/dL — AB (ref 0.50–1.10)
Calcium: 8.2 mg/dL — ABNORMAL LOW (ref 8.4–10.5)
GFR calc non Af Amer: 28 mL/min — ABNORMAL LOW (ref >90)
GFR, EST AFRICAN AMERICAN: 32 mL/min — AB (ref >90)
Glucose, Bld: 92 mg/dL (ref 70–99)
POTASSIUM: 3 meq/L — AB (ref 3.7–5.3)
Sodium: 143 mEq/L (ref 137–147)
TOTAL PROTEIN: 5.3 g/dL — AB (ref 6.0–8.3)
Total Bilirubin: 0.5 mg/dL (ref 0.3–1.2)

## 2014-01-13 LAB — HEPATITIS B SURFACE ANTIGEN: HEP B S AG: NEGATIVE

## 2014-01-13 LAB — GLUCOSE, CAPILLARY
GLUCOSE-CAPILLARY: 245 mg/dL — AB (ref 70–99)
Glucose-Capillary: 80 mg/dL (ref 70–99)
Glucose-Capillary: 92 mg/dL (ref 70–99)
Glucose-Capillary: 102 mg/dL — ABNORMAL HIGH (ref 70–99)
Glucose-Capillary: 102 mg/dL — ABNORMAL HIGH (ref 70–99)
Glucose-Capillary: 95 mg/dL (ref 70–99)

## 2014-01-13 LAB — HEPATITIS C ANTIBODY: HCV Ab: NEGATIVE

## 2014-01-13 LAB — HEPATITIS B SURFACE ANTIBODY,QUALITATIVE: Hep B S Ab: NEGATIVE

## 2014-01-13 SURGERY — BALLOON DILATION
Anesthesia: Moderate Sedation

## 2014-01-13 SURGERY — EGD (ESOPHAGOGASTRODUODENOSCOPY)
Anesthesia: Monitor Anesthesia Care

## 2014-01-13 MED ORDER — IBUPROFEN 100 MG/5ML PO SUSP
200.0000 mg | Freq: Three times a day (TID) | ORAL | Status: DC | PRN
  Administered 2014-01-18: 200 mg via ORAL
  Filled 2014-01-13 (×2): qty 10

## 2014-01-13 MED ORDER — MIDAZOLAM HCL 5 MG/5ML IJ SOLN
INTRAMUSCULAR | Status: DC | PRN
  Administered 2014-01-13: 1 mg via INTRAVENOUS

## 2014-01-13 MED ORDER — HYDRALAZINE HCL 20 MG/ML IJ SOLN
INTRAMUSCULAR | Status: DC | PRN
Start: 2014-01-13 — End: 2014-01-13
  Administered 2014-01-13: 2.5 mg via INTRAVENOUS

## 2014-01-13 MED ORDER — STERILE WATER FOR INJECTION IJ SOLN
INTRAMUSCULAR | Status: AC
  Administered 2014-01-13: 5 mL via INTRAMUSCULAR
  Filled 2014-01-13: qty 10

## 2014-01-13 MED ORDER — LIDOCAINE HCL (CARDIAC) 20 MG/ML IV SOLN
INTRAVENOUS | Status: DC | PRN
  Administered 2014-01-13: 30 mg via INTRAVENOUS

## 2014-01-13 MED ORDER — SINCALIDE 5 MCG IJ SOLR
INTRAMUSCULAR | Status: AC
  Administered 2014-01-13: 0.8 ug via INTRAVENOUS
  Filled 2014-01-13: qty 5

## 2014-01-13 MED ORDER — PROPOFOL INFUSION 10 MG/ML OPTIME
INTRAVENOUS | Status: DC | PRN
  Administered 2014-01-13: 25 ug/kg/min via INTRAVENOUS

## 2014-01-13 MED ORDER — PROPOFOL 10 MG/ML IV BOLUS
INTRAVENOUS | Status: DC | PRN
  Administered 2014-01-13: 10 mg via INTRAVENOUS

## 2014-01-13 MED ORDER — HYDRALAZINE HCL 20 MG/ML IJ SOLN
5.0000 mg | Freq: Once | INTRAMUSCULAR | Status: AC
  Administered 2014-01-13: 5 mg via INTRAVENOUS

## 2014-01-13 MED ORDER — STERILE WATER FOR INJECTION IJ SOLN
5.0000 mL | Freq: Once | INTRAMUSCULAR | Status: AC
  Administered 2014-01-13: 5 mL via INTRAMUSCULAR
  Filled 2014-01-13: qty 5

## 2014-01-13 MED ORDER — SINCALIDE 5 MCG IJ SOLR
0.0200 ug/kg | Freq: Once | INTRAMUSCULAR | Status: AC
  Administered 2014-01-13: 0.8 ug via INTRAVENOUS
  Filled 2014-01-13: qty 5

## 2014-01-13 MED ORDER — HYDRALAZINE HCL 20 MG/ML IJ SOLN
5.0000 mg | Freq: Four times a day (QID) | INTRAMUSCULAR | Status: DC | PRN
  Administered 2014-01-13 – 2014-01-15 (×2): 5 mg via INTRAVENOUS
  Filled 2014-01-13 (×4): qty 1

## 2014-01-13 MED ORDER — POTASSIUM CHLORIDE 10 MEQ/100ML IV SOLN
10.0000 meq | INTRAVENOUS | Status: AC
  Administered 2014-01-13 (×2): 10 meq via INTRAVENOUS
  Filled 2014-01-13 (×3): qty 100

## 2014-01-13 MED ORDER — PNEUMOCOCCAL VAC POLYVALENT 25 MCG/0.5ML IJ INJ
0.5000 mL | INJECTION | INTRAMUSCULAR | Status: AC
  Administered 2014-01-14: 0.5 mL via INTRAMUSCULAR
  Filled 2014-01-13: qty 0.5

## 2014-01-13 MED ORDER — LACTATED RINGERS IV SOLN
INTRAVENOUS | Status: DC | PRN
  Administered 2014-01-13: 12:00:00 via INTRAVENOUS

## 2014-01-13 NOTE — Progress Notes (Signed)
Daily Rounding Note  01/13/2014, 10:15 AM  LOS: 2 days   SUBJECTIVE:       Pt is in [facility] for HIDA scan.  Yesterday walked 15 feet with rolling walker.   OBJECTIVE:         Vital signs in last 24 hours:    Temp:  [98.1 F (36.7 C)-98.2 F (36.8 C)] 98.1 F (36.7 C) (07/22 0424) Pulse Rate:  [60-66] 66 (07/22 0424) Resp:  [16-18] 16 (07/22 0424) BP: (144-208)/(71-98) 171/79 mmHg (07/22 0424) SpO2:  [96 %-98 %] 97 % (07/22 0424) Weight:  [41.096 kg (90 lb 9.6 oz)] 41.096 kg (90 lb 9.6 oz) (07/21 2041) Last BM Date: 01/12/14 Not reexamined.   Intake/Output from previous day: 07/21 0701 - 07/22 0700 In: 1092.5 [P.O.:240; I.V.:852.5] Out: 451 [Urine:450; Stool:1]  Intake/Output this shift:    Lab Results:  Recent Labs  01/11/14 1713 01/12/14 0426  WBC 10.7* 7.1  HGB 13.0 12.0  HCT 40.3 37.5  PLT 178 146*   BMET  Recent Labs  01/11/14 1713 01/12/14 0426 01/13/14 0307  NA 138 137 143  K 4.7 4.3 3.0*  CL 100 103 109  CO2 24 21 23   GLUCOSE 160* 120* 92  BUN 18 19 18   CREATININE 1.55* 1.53* 1.61*  CALCIUM 9.2 9.0 8.2*   LFT  Recent Labs  01/11/14 1713 01/12/14 0426 01/13/14 0307  PROT 7.4 6.3 5.3*  ALBUMIN 3.0* 2.5* 2.1*  AST 458* 234* 72*  ALT 199* 139* 68*  ALKPHOS 277* 217* 156*  BILITOT 1.6* 1.2 0.5   PT/INR  Recent Labs  01/12/14 1537  LABPROT 14.7  INR 1.15   Hepatitis Panel  Recent Labs  01/12/14 1537  HEPBSAG NEGATIVE  HCVAB NEGATIVE    Studies/Results: Ct Head Wo Contrast  01/11/2014   CLINICAL DATA:  Weakness and confusion.  Falls.  EXAM: CT HEAD WITHOUT CONTRAST  TECHNIQUE: Contiguous axial images were obtained from the base of the skull through the vertex without intravenous contrast.  COMPARISON:  08/2012  FINDINGS: There is no evidence of acute cortical infarct, intracranial hemorrhage, mass, midline shift, or extra-axial fluid collection. Moderate cerebral  atrophy is unchanged. Patchy and confluent hypodensities in the subcortical and periventricular white matter are similar to the prior study and compatible with moderate chronic small vessel ischemic disease.  Prior left cataract extraction is noted. Mastoid air cells and visualized paranasal sinuses are clear. Mild carotid siphon calcification is noted bilaterally.  IMPRESSION: No evidence of acute intracranial abnormality. Moderate chronic small vessel ischemic disease.   Electronically Signed   By: Logan Bores   On: 01/11/2014 18:55   US Abdomen Complete  01/11/2014   CLINICAL DATA:  Elevated liver function tests.  Abdominal pain.  EXAM: ULTRASOUND ABDOMEN COMPLETE  COMPARISON:  CT of the abdomen and pelvis without contrast 01/09/2011.  FINDINGS: Gallbladder:  There is some echogenic material lying dependently within the gallbladder which does not shadow, compatible with tumefactive sludge. Gallbladder appears moderately distended. Gallbladder wall is thickened at 6 mm. However, there is no pericholecystic fluid, and per report, the patient did not exhibit a sonographic Murphy's sign  on examination.  Common bile duct:  Diameter: 8.5 mm in the porta hepatis.  Liver:  Hepatic echotexture is heterogeneous with multifocal areas of increased echogenicity and slightly nodular contour, suggestive of underlying cirrhosis. Mild prominence of intrahepatic bile ducts.  IVC:  No abnormality visualized.  Pancreas:  Unremarkable.  Spleen:  Size and appearance within normal limits.  6.6 cm in length.  Right Kidney:  Length: 9.2 cm. Echogenicity within normal limits. 3.1 x 1.8 x 1.2 cm anechoic lesion in the parapelvic aspect of the lower pole of the right kidney, compatible with parapelvic cyst. No hydronephrosis visualized.  Left Kidney:  Length: 10.3 cm. Echogenicity within normal limits. In the interpolar region there is a 1.7 x 1.4 x 1.3 cm lesion that is generally anechoic with increased through transmission, but  contains a thin internal septation, most compatible with a mildly complex cyst. No hydronephrosis visualized.  Abdominal aorta:  No aneurysm visualized.  Multiple atherosclerotic calcifications.  Other findings:  None.  IMPRESSION: 1. Tumefactive sludge in the gallbladder. The gallbladder wall is mildly thickened, and the gallbladder is moderately distended, however, per report, the patient did not exhibit a sonographic Murphy's sign on examination. Overall, findings are equivocal for acute cholecystitis and clinical correlation is recommended. It is worth noting, however, that the gallbladder wall appeared markedly thickened on remote prior CT examination 01/09/2011, which suggests at this may be chronic in this individual. 2. Mild intra and extrahepatic biliary ductal dilatation. The possibility of sludge in the common bile duct should be considered. This could be further evaluated with MRCP if clinically appropriate. 3. Nodular liver contour and heterogeneous echotexture of the liver parenchyma, suggestive of cirrhosis. 4. Additional findings, as above.   Electronically Signed   By: Vinnie Langton M.D.   On: 01/11/2014 20:08   Dg Abd Acute W/chest  01/11/2014   CLINICAL DATA:  Abdominal pain.  EXAM: ACUTE ABDOMEN SERIES (ABDOMEN 2 VIEW & CHEST 1 VIEW)  COMPARISON:  Chest x-ray 12/10/2011  FINDINGS: Heart is normal size. Tortuous, calcified aorta. Lungs are clear. No effusions.  Gas within nondistended large and small bowel. No evidence of free air, organomegaly or suspicious calcification. Moderate stool burden in the colon.  No acute bony abnormality.  IMPRESSION: No evidence of bowel obstruction or free air. Moderate stool burden.  No acute cardiopulmonary disease.   Electronically Signed   By: Rolm Baptise M.D.   On: 01/11/2014 19:08    ASSESMENT:   * Abdominal pain. R/O cholecystitis.  GB sludge, mild ductal dilatation on imaging. LFTs abnormal. Rule out cholecystitis  * Chronic intermittent  "vomiting". On detailed history this sounds more like dysphagia and/or regurgitation. Some dark stool and some blood in emesis reported. Rule out ulcers, rule out varices (none on CT and no Portal htn on CT)  * Cirrhotic looking liver on ultrasound. Does not drink ETOH  * Stable memory loss and confusion. Ischemic small vessel cerebrovascular dz on CT  * Advanced age.  * Hypertension. systolics readings in 865H and diastolics to 84O.      PLAN   *  Note that I just stopped TID Sub Q Heparin, last given at 0600 this morning.  This to allow for interventions during EGD if needed. *  Await HIDA scan. On table currently with about 30 minutes remaining to completion of study. EGD after that   Azucena Freed  01/13/2014, 10:15 AM Pager: [phone]      Attending physician's note   I have taken an  interval history, reviewed the chart and examined the patient. I agree with the Advanced Practitioner's note, impression and recommendations. Awaiting HIDA scan results. LFTs rapidly trending down concerning for CBD stone or CBD sludge-may have passed. Likely will need surgical consult and MRCP to further evaluate biliary problems. EGD today.   Pricilla Riffle. Fuller Plan, MD [facility]

## 2014-01-13 NOTE — Consult Note (Signed)
I have seen and examined the pt and agree with PA-Osborne's progress note. Pt has no RUQ Pain.  All pain is infraumb.  She states its been there chronically. Will await MRCP

## 2014-01-13 NOTE — Progress Notes (Signed)
Patient taken to MRI for ordered MRCP, getting down there patient stated she does not want to have anything done she wants to go home. daughter in the room, called the interpreter line again to talk to them and try to explain to them re reason for the test and she has been NPO because cause of that test. She stated she does not want anything done anymore she just want to eat and go home. Patient brought back to room, daughter stated she will have her other sister and brother come to talk to her to know if she will change her mind. Notified Dr. Karleen Hampshire, who stated to restart patient back on clear liq.

## 2014-01-13 NOTE — Anesthesia Preprocedure Evaluation (Signed)
Anesthesia Evaluation  Patient identified by MRN, date of birth, ID band Patient awake    Reviewed: Allergy & Precautions, H&P   Airway       Dental   Pulmonary neg pulmonary ROS,          Cardiovascular hypertension,     Neuro/Psych    GI/Hepatic GI history noted. CE   Endo/Other  diabetes  Renal/GU Renal disease     Musculoskeletal   Abdominal   Peds  Hematology   Anesthesia Other Findings   Reproductive/Obstetrics                           Anesthesia Physical Anesthesia Plan  ASA: III  Anesthesia Plan: MAC   Post-op Pain Management:    Induction: Intravenous  Airway Management Planned: Simple Face Mask  Additional Equipment:   Intra-op Plan:   Post-operative Plan:   Informed Consent: I have reviewed the patients History and Physical, chart, labs and discussed the procedure including the risks, benefits and alternatives for the proposed anesthesia with the patient or authorized representative who has indicated his/her understanding and acceptance.     Plan Discussed with: CRNA and Anesthesiologist  Anesthesia Plan Comments:         Anesthesia Quick Evaluation

## 2014-01-13 NOTE — Progress Notes (Signed)
Patient back from Endo, apert denies pain, BP in the 200s in endo even after given labetalol per Endo nurse.  Back in room, blood pressure in the 200s again, notified Dr. Karleen Hampshire who ordered hydralazine, 5mg  given BP down to 155/74 HR-85. Will continue to assess patient.

## 2014-01-13 NOTE — Progress Notes (Signed)
Got report on this patient from [place], night RN this morning after patient had already been sent for hydroscan. Patient has not yet returned to floor. Gave report to [facility], RN who will be taking over patient care. Patient in EGD currently.

## 2014-01-13 NOTE — Progress Notes (Signed)
Pt returned from procedure. Denies any complaints of pain via interpreter services.  Educated patient and daughter that she may clear liquids at this time via interpreter services.

## 2014-01-13 NOTE — H&P (View-Only) (Signed)
[facility] Consult: 1:36 PM 01/12/2014  LOS: 1 day    Referring Provider: Dr Hurley Cisco of [facility]:  A clinic on [address] provides her care.  Primary Gastroenterologist:  Althia Forts.    Reason for Consultation:  Abdominal pain and abnormal CT scan of liver, biliary ducts, GB   HPI: Tammy Willis is a 80 y.o. female. PMH of DM2, HTN, appendectomy, extraction of kidney stones.  Originally from [place], living in [place] for ~ 6 years.   In recent months having post prandial N/V intermittently for at least 6 months.  Never referred by PMD for further testing. Takes Ibuprofen infrequently. Pt has told dtr that there is occasional blood in the emesis and dark stools.  Developed abdominal pain and lethargy in last 3 days.  Has not gotten up from bed.   Her LFTs are elevated   Ultrsound shows GB sludge, distended GB: equivocal for cholecystitis (GB wall thick on CT of 2012 too).  Intra and extra hepatic ducts are mildly dilated, CBD measurement not dictated.  Liver appears cirrhotic.   Background of stable memory loss, family does not feel it is acutely worse.  She is more lethargic.  Before a few days ago was able to walk independently within the house.  No history of etoh abuse or of liver issues.   Past Medical History  Diagnosis Date  . Hypertension   . Diabetes mellitus     Past Surgical History  Procedure Laterality Date  . Kidney stone surgery    . Cataract extraction Bilateral     Prior to Admission medications   Medication Sig Start Date End Date Taking? Authorizing Provider  glimepiride (AMARYL) 4 MG tablet Take 4 mg by mouth daily before breakfast.     Yes Historical Provider, MD  ibuprofen (ADVIL,MOTRIN) 100 MG/5ML suspension Take 200 mg by mouth daily as needed  for fever or mild pain.   Yes Historical Provider, MD  lisinopril-hydrochlorothiazide (PRINZIDE,ZESTORETIC) 20-25 MG per tablet Take 1 tablet by mouth daily.     Yes Historical Provider, MD  metFORMIN (GLUCOPHAGE) 1000 MG tablet Take 1,000 mg by mouth 2 (two) times daily with a meal.     Yes Historical Provider, MD  Multiple Vitamins-Minerals (MULTIVITAMINS THER. W/MINERALS) TABS Take 1 tablet by mouth daily. Centrum silver    Yes Historical Provider, MD    Scheduled Meds: . ciprofloxacin  400 mg Intravenous Q12H  . heparin  5,000 Units Subcutaneous 3 times per day  . insulin aspart  0-9 Units Subcutaneous 6 times per day  . metronidazole  500 mg Intravenous Q8H   Infusions: . sodium chloride 75 mL/hr at 01/12/14 0955   PRN Meds: gi cocktail, labetalol   Allergies as of 01/11/2014  . (No Known Allergies)    History reviewed. No pertinent family history.  History   Social History  . Marital Status: Single    Spouse Name: N/A    Number of Children: N/A  . Years of Education: N/A   Occupational History  . Not on file.  Social History Main Topics  . Smoking status: Never Smoker   . Smokeless tobacco: Not on file  . Alcohol Use: No  . Drug Use: No  . Sexual Activity: No   Other Topics Concern  . Not on file   Social History Narrative  . No narrative on file    REVIEW OF SYSTEMS: Constitutional:  Always thin, family unclear about weight loss ENT:  No nose bleeds.  Poor dental condition makes chewing food difficult. Pulm:  No dyspnea CV:  No palpitations, no LE edema.  GU:  No hematuria, no frequency GI:  Per HPI Heme:  No issues with excessive bleeding   Transfusions:  None ever Neuro:  No headaches, no peripheral tingling or numbness Derm: rash across bridge of nose that is pruritic.  Endocrine:  No sweats or chills.  No polyuria or dysuria Immunization:  Not queried.  Travel:  None beyond local counties in last few months.    PHYSICAL EXAM: Vital signs  in last 24 hours: Filed Vitals:   01/12/14 0955  BP: 155/69  Pulse: 55  Temp: 98.4 F (36.9 C)  Resp: 18   Wt Readings from Last 3 Encounters:  01/11/14 39.4 kg (86 lb 13.8 oz)  09/19/12 45 kg (99 lb 3.3 oz)    General: thin, aged, frail elderly woman Head:  No asymmetry or   Eyes:  No icterus or pallor Ears:  Not HOH  Nose:  No discharge Mouth:  Clear, moist, tongue midline.  Poor teeth, bul of them gone. Neck:  No mass, TMG or bruit Lungs:  Clear bil.  Good BS, no dyspnea or cough Heart: Irreg, Irreg, rate nont accelerated or slow. No MRG Abdomen:  Soft, ND.  No HSM , no mass, no bruits.  Tender in RUQ (mild).   Rectal: deferred   Musc/Skeltl: no joint swelling, +kyphosis Extremities:  No CCE  Neurologic:  Oriented to self and "hospital"  Not to town or year.  No tremor, limb weakness or asterixis.  Skin:  No jaundice, no sores Tattoos:  none Nodes:  No cervical adenopathy.    Psych:  Lethargic, cooperative, arouseable.  Not agitated.   Intake/Output from previous day: 07/20 0701 - 07/21 0700 In: 1090.8 [I.V.:690.8; IV Piggyback:400] Out: 50 [Urine:50] Intake/Output this shift: Total I/O In: 0  Out: 250 [Urine:250]  LAB RESULTS:  Recent Labs  01/11/14 1713 01/12/14 0426  WBC 10.7* 7.1  HGB 13.0 12.0  HCT 40.3 37.5  PLT 178 146*   BMET Lab Results  Component Value Date   NA 137 01/12/2014   NA 138 01/11/2014   NA 139 08/25/2011   K 4.3 01/12/2014   K 4.7 01/11/2014   K 3.5 08/25/2011   CL 103 01/12/2014   CL 100 01/11/2014   CL 102 08/25/2011   CO2 21 01/12/2014   CO2 24 01/11/2014   CO2 25 08/25/2011   GLUCOSE 120* 01/12/2014   GLUCOSE 160* 01/11/2014   GLUCOSE 207* 08/25/2011   BUN 19 01/12/2014   BUN 18 01/11/2014   BUN 20 08/25/2011   CREATININE 1.53* 01/12/2014   CREATININE 1.55* 01/11/2014   CREATININE 1.06 08/25/2011   CALCIUM 9.0 01/12/2014   CALCIUM 9.2 01/11/2014   CALCIUM 10.2 08/25/2011   LFT  Recent Labs  01/11/14 1713 01/12/14 0426  PROT 7.4 6.3    ALBUMIN 3.0* 2.5*  AST 458* 234*  ALT 199* 139*  ALKPHOS 277* 217*  BILITOT 1.6* 1.2   PT/INR No results found for  this basename: INR,  PROTIME   Hepatitis Panel No results found for this basename: HEPBSAG, HCVAB, HEPAIGM, HEPBIGM,  in the last 72 hours Lipase     Component Value Date/Time   LIPASE 15 01/11/2014 1713    Drugs of Abuse  No results found for this basename: labopia,  cocainscrnur,  labbenz,  amphetmu,  thcu,  labbarb     RADIOLOGY STUDIES: Ct Head Wo Contrast 01/11/2014   COMPARISON:  08/2012  FINDINGS: There is no evidence of acute cortical infarct, intracranial hemorrhage, mass, midline shift, or extra-axial fluid collection. Moderate cerebral atrophy is unchanged. Patchy and confluent hypodensities in the subcortical and periventricular white matter are similar to the prior study and compatible with moderate chronic small vessel ischemic disease.  Prior left cataract extraction is noted. Mastoid air cells and visualized paranasal sinuses are clear. Mild carotid siphon calcification is noted bilaterally.  IMPRESSION: No evidence of acute intracranial abnormality. Moderate chronic small vessel ischemic disease.   Electronically Signed   By: Logan Bores   On: 01/11/2014 18:55   US Abdomen Complete 01/11/2014     COMPARISON:  CT of the abdomen and pelvis without contrast 01/09/2011.  FINDINGS: Gallbladder:  There is some echogenic material lying dependently within the gallbladder which does not shadow, compatible with tumefactive sludge. Gallbladder appears moderately distended. Gallbladder wall is thickened at 6 mm. However, there is no pericholecystic fluid, and per report, the patient did not exhibit a sonographic Murphy's sign on examination.  Common bile duct:  Diameter: 8.5 mm in the porta hepatis.  Liver:  Hepatic echotexture is heterogeneous with multifocal areas of increased echogenicity and slightly nodular contour, suggestive of underlying cirrhosis. Mild prominence  of intrahepatic bile ducts.  IVC:  No abnormality visualized.  Pancreas:  Unremarkable.  Spleen:  Size and appearance within normal limits.  6.6 cm in length.  Right Kidney:  Length: 9.2 cm. Echogenicity within normal limits. 3.1 x 1.8 x 1.2 cm anechoic lesion in the parapelvic aspect of the lower pole of the right kidney, compatible with parapelvic cyst. No hydronephrosis visualized.  Left Kidney:  Length: 10.3 cm. Echogenicity within normal limits. In the interpolar region there is a 1.7 x 1.4 x 1.3 cm lesion that is generally anechoic with increased through transmission, but contains a thin internal septation, most compatible with a mildly complex cyst. No hydronephrosis visualized.  Abdominal aorta:  No aneurysm visualized.  Multiple atherosclerotic calcifications.  Other findings:  None.  IMPRESSION: 1. Tumefactive sludge in the gallbladder. The gallbladder wall is mildly thickened, and the gallbladder is moderately distended, however, per report, the patient did not exhibit a sonographic Murphy's sign on examination. Overall, findings are equivocal for acute cholecystitis and clinical correlation is recommended. It is worth noting, however, that the gallbladder wall appeared markedly thickened on remote prior CT examination 01/09/2011, which suggests at this may be chronic in this individual. 2. Mild intra and extrahepatic biliary ductal dilatation. The possibility of sludge in the common bile duct should be considered. This could be further evaluated with MRCP if clinically appropriate. 3. Nodular liver contour and heterogeneous echotexture of the liver parenchyma, suggestive of cirrhosis. 4. Additional findings, as above.   Electronically Signed   By: Vinnie Langton M.D.   On: 01/11/2014 20:08   Dg Abd Acute W/chest 01/11/2014   CLINICAL DATA:  Abdominal pain.  EXAM: ACUTE ABDOMEN SERIES (ABDOMEN 2 VIEW & CHEST 1 VIEW)  COMPARISON:  Chest x-ray 12/10/2011  FINDINGS: Heart is normal size. Tortuous,  calcified aorta. Lungs are clear. No effusions.  Gas within nondistended large and small bowel. No evidence of free air, organomegaly or suspicious calcification. Moderate stool burden in the colon.  No acute bony abnormality.  IMPRESSION: No evidence of bowel obstruction or free air. Moderate stool burden.  No acute cardiopulmonary disease.   Electronically Signed   By: Rolm Baptise M.D.   On: 01/11/2014 19:08    ENDOSCOPIC STUDIES: None ever  IMPRESSION:   *  Abdominal pain. R/O cholecystitis. GB sludge, mild ductal dilatation on imaging. LFTs abnormal.  Rule out cholecystitis  *  Chronic intermittent "vomiting".  On detailed history this sounds more like dysphagia and/or regurgitation.  Some dark stool and some blood in emesis reported. Rule out ulcers, rule out varices (none on CT and no Portal htn on CT)  *  Cirrhotic looking liver on ultrasound. Does not drink ETOH  * Stable memory loss and confusion.  Ischemic small vessel cerebrovascular dz on CT  *  Advanced age.   *  Hypertension. systolics readings in 915A and diastolics to 56P.     PLAN:     *  HIDA scan today *  EGD with possible dilatation  at 11AM tomorrow.  Discussed with dtr and son-in law who are agreeable.  Translator present for interview.  *  Hepatitis serologies. Coags   Azucena Freed  01/12/2014, 1:36 PM Pager: [phone]      Attending physician's note   I have taken a history, examined the patient and reviewed the chart. I agree with the Advanced Practitioner's note, impression and recommendations. R/O cholecystitis, CBD stone. R/O ulcer, GERD, esophageal stricture. HIDA today. Hepatitis serologies. EGD possible dilation tomorrow.   Ladene Artist, MD [facility]

## 2014-01-13 NOTE — Progress Notes (Addendum)
[facility] Progress Note   Tammy Willis KDT:[id number] DOB: 01/02/1925 DOA: 01/11/2014 PCP: Pcp Not In System  Brief narrative: Tammy Willis is a 80 y.o. female  with type 2 diabetes mellitus, hypertension who presents with abdominal pain. She's been having pain after eating for a couple of months now. She decided to come to the hospital due to significant weakness. In the ER she was found to have an ultrasound which was negative for an acute cholecystitis. LFTs are elevated-  alkaline phosphatase is 217. She waas found to have sludge in the [place]. HIDA scan abnormal, she underwent EGD which showed a small hiatal hernia. Surgery consulted and MRCP ordered to evaluate for biliary cholecystitis.      Abdominal pain, other specified site--  Nausea with vomiting-dysphagia-- sludge in gallbladder -Difficult to tell if this is related to a chronic cholecystitis versus gastritis or PUD -GI has evaluated her and have ordered a HIDA scan whch was abnormal . EGD showed small hiatal hernia.  -SLP has evaluated her and final problem with pharyngeal part of swallowing -Will have clear liquids after HIDA scan as ordered by GI    Active Problems: Cirrhosis of the liver? Ultrasound reveals nodular contour and heterogeneous areas of increased echogenicity -Hepatitis panel ordered by GI and is negative.   CKD 3 versus acute renal failure -Last BUN and creatinine that we have is from March 2013 when BUN was 20 and creatinine 1.06 -She she is receiving normal saline at 75 cc an hour-follow creatinine with hydration    Diabetes mellitus -Low dose sliding scale insulin-hold metformin    Accelerated  HTN (hypertension) -Hold lisinopril/HCTZ-IV hydralazine. And repeat BP in one hour.  Mild Thrombocytopenia - recheck in AM  Elevated lactic acid - this was mild- cont to hydrate  Hypokalemia: - replete as needed.     Code Status: full code  Family Communication: with daughter  and son-in-law using interpretor over the phone Disposition Plan: to be determined   Consultants:  GI  Surgery  Procedures:  none  Antibiotics: Anti-infectives   Start     Dose/Rate Route Frequency Ordered Stop   01/11/14 2115  ciprofloxacin (CIPRO) IVPB 400 mg  Status:  Discontinued     400 mg 200 mL/hr over 60 Minutes Intravenous Every 12 hours 01/11/14 2102 01/12/14 1533   01/11/14 2115  metroNIDAZOLE (FLAGYL) IVPB 500 mg  Status:  Discontinued     500 mg 100 mL/hr over 60 Minutes Intravenous Every 8 hours 01/11/14 2102 01/12/14 1533       DVT prophylaxis:  heparin   Objective: Filed Weights   01/11/14 2319 01/12/14 2041  Weight: 39.4 kg (86 lb 13.8 oz) 41.096 kg (90 lb 9.6 oz)    Intake/Output Summary (Last 24 hours) at 01/13/14 1459 Last data filed at 01/13/14 1208  Gross per 24 hour  Intake 1342.5 ml  Output    201 ml  Net 1141.5 ml     Vitals Filed Vitals:   01/13/14 1230 01/13/14 1240 01/13/14 1323 01/13/14 1325  BP: 186/56 215/77 212/78 217/77  Pulse: 62 68  67  Temp:    98.1 F (36.7 C)  TempSrc:    Oral  Resp: 17 19  16   Height:      Weight:      SpO2: 100% 100%  100%    Exam: General: No acute respiratory distress-awake alert oriented x3 Lungs: Clear to auscultation bilaterally without wheezes or crackles Cardiovascular: Regular rate and rhythm without murmur gallop or  rub normal S1 and S2 Abdomen: mildly tender in epigastrium , nondistended, soft, bowel sounds positive, no rebound, no ascites, no appreciable mass Extremities: No significant cyanosis, clubbing, or edema bilateral lower extremities  Data Reviewed: Basic Metabolic Panel:  Recent Labs Lab 01/11/14 1713 01/12/14 0426 01/13/14 0307  NA 138 137 143  K 4.7 4.3 3.0*  CL 100 103 109  CO2 24 21 23   GLUCOSE 160* 120* 92  BUN 18 19 18   CREATININE 1.55* 1.53* 1.61*  CALCIUM 9.2 9.0 8.2*   Liver Function Tests:  Recent Labs Lab 01/11/14 1713 01/12/14 0426 01/13/14 0307   AST 458* 234* 72*  ALT 199* 139* 68*  ALKPHOS 277* 217* 156*  BILITOT 1.6* 1.2 0.5  PROT 7.4 6.3 5.3*  ALBUMIN 3.0* 2.5* 2.1*    Recent Labs Lab 01/11/14 1713  LIPASE 15   No results found for this basename: AMMONIA,  in the last 168 hours CBC:  Recent Labs Lab 01/11/14 1713 01/12/14 0426  WBC 10.7* 7.1  NEUTROABS 9.8*  --   HGB 13.0 12.0  HCT 40.3 37.5  MCV 91.4 93.8  PLT 178 146*   Cardiac Enzymes: No results found for this basename: CKTOTAL, CKMB, CKMBINDEX, TROPONINI,  in the last 168 hours BNP (last 3 results) No results found for this basename: PROBNP,  in the last 8760 hours CBG:  Recent Labs Lab 01/12/14 2035 01/13/14 0010 01/13/14 0419 01/13/14 1204 01/13/14 1324  GLUCAP 77 245* 80 92 102*    No results found for this or any previous visit (from the past 240 hour(s)).   Studies:  Recent x-ray studies have been reviewed in detail by the Attending Physician  Scheduled Meds:  Scheduled Meds: . feeding supplement (RESOURCE BREEZE)  1 Container Oral Q2000  . insulin aspart  0-9 Units Subcutaneous 6 times per day  . [START ON 01/14/2014] pneumococcal 23 valent vaccine  0.5 mL Intramuscular Tomorrow-1000   Continuous Infusions: . sodium chloride 75 mL/hr at 01/12/14 2328    Time spent on care of this patient: >35 min   Donni Oglesby, MD 01/13/2014, 2:59 PM  LOS: 2 days   [facility] Office  [phone] Pager - Text Page per [email or web address]  If 7PM-7AM, please contact night-coverage [email or web address]

## 2014-01-13 NOTE — Op Note (Signed)
[facility] [place], [id number]   ENDOSCOPY PROCEDURE REPORT  PATIENT: Tammy Willis, Tammy Willis  MR#: [id number] BIRTHDATE: May 26, 1925 , 89  yrs. old GENDER: Female ENDOSCOPIST: Ladene Artist, MD, [facility] REFERRED BY:  [facility] PROCEDURE DATE:  01/13/2014 PROCEDURE:  EGD, diagnostic ASA CLASS:     Class III INDICATIONS:  Nausea.   Vomiting.   Dysphagia.   abdominal pain in the upper right quadrant. MEDICATIONS: MAC sedation, administered by CRNA TOPICAL ANESTHETIC: none DESCRIPTION OF PROCEDURE: After the risks benefits and alternatives of the procedure were thoroughly explained, informed consent was obtained.  The PENTAX GASTOROSCOPE [id number] endoscope was introduced through the mouth and advanced to the second portion of the duodenum. Without limitations.  The instrument was slowly withdrawn as the mucosa was fully examined.    ESOPHAGUS: The mucosa of the esophagus appeared normal. STOMACH: The mucosa and folds of the stomach appeared normal. DUODENUM: The duodenal mucosa showed no abnormalities in the bulb and second portion of the duodenum.  Retroflexed views revealed a small hiatal hernia.  The scope was then withdrawn from the patient and the procedure completed.  COMPLICATIONS: There were no complications.  ENDOSCOPIC IMPRESSION: 1.   Small hiatal hernia 2.   The EGD otherwise appeared normal  RECOMMENDATIONS: 1.  Anti-reflux regimen 2.  PPI qam  eSigned:  Ladene Artist, MD, [facility] 01/13/2014 12:00 PM

## 2014-01-13 NOTE — Progress Notes (Signed)
Patient stated she want the MRCP done, MRI notified and plan to do it around 7PM, BP in the 190s, PRN hydralazine not due yet, informed Dr. Karleen Hampshire who stated to give i time 5mg  hydralazine which was given and reported off to night shift nurse to F/U.

## 2014-01-13 NOTE — Interval H&P Note (Signed)
History and Physical Interval Note:  01/13/2014 11:29 AM  Tammy Willis  has presented today for surgery, with the diagnosis of dysphagia and vomiting.  cirrhosis.  The various methods of treatment have been discussed with the patient and family. After consideration of risks, benefits and other options for treatment, the patient has consented to  Procedure(s): ESOPHAGOGASTRODUODENOSCOPY (EGD) (N/A) BALLOON DILATION (N/A) as a surgical intervention .  The patient's history has been reviewed, patient examined, no change in status, stable for surgery.  I have reviewed the patient's chart and labs.  Questions were answered to the patient's satisfaction.     Pricilla Riffle. Fuller Plan MD

## 2014-01-13 NOTE — Anesthesia Postprocedure Evaluation (Signed)
  Anesthesia Post-op Note  Patient: Tammy Willis  Procedure(s) Performed: Procedure(s): ESOPHAGOGASTRODUODENOSCOPY (EGD) (N/A)  Patient Location: PACU  Anesthesia Type:MAC  Level of Consciousness: awake  Airway and Oxygen Therapy: Patient Spontanous Breathing  Post-op Pain: mild  Post-op Assessment: Post-op Vital signs reviewed  Post-op Vital Signs: Reviewed  Last Vitals:  Filed Vitals:   01/13/14 1240  BP: 215/77  Pulse: 68  Temp:   Resp: 19    Complications: No apparent anesthesia complications

## 2014-01-13 NOTE — Progress Notes (Signed)
SLP Cancellation Note  Patient Details Name: [name] MRN: [id number] DOB: 01-29-1925   Cancelled treatment:       Reason Eval/Treat Not Completed: Patient at procedure or test/unavailable   [name], [name] 01/13/2014, 8:24 AM

## 2014-01-13 NOTE — Transfer of Care (Signed)
Immediate Anesthesia Transfer of Care Note  Patient: Tammy Willis  Procedure(s) Performed: Procedure(s): ESOPHAGOGASTRODUODENOSCOPY (EGD) (N/A)  Patient Location: PACU  Anesthesia Type:MAC  Level of Consciousness: alert , oriented, patient cooperative and responds to stimulation  Airway & Oxygen Therapy: Patient Spontanous Breathing and Patient connected to face mask oxygen  Post-op Assessment: Report given to PACU RN and Post -op Vital signs reviewed and stable  Post vital signs: Reviewed and stable  Complications: No apparent anesthesia complications

## 2014-01-13 NOTE — Consult Note (Signed)
Tammy Willis [birth year]  [id number].   Requesting MD: Dr. Lucio Edward Chief Complaint/Reason for Consult: Gallbladder sludge, question gallbladder disease HPI: This is an 80 year old Hispanic female who presented to the emergency department several days ago with abdominal pain. She states that her abdominal pain is periumbilical. She states that she has had this pain "for a very long time."  She occasionally has nausea and vomiting and occasionally hematemesis but this is never associated with food. Her abdominal pain is never associated with eating either. She does not have pain in the right upper portion of her abdomen. She denies diarrhea, constipation, hematochezia. She has never had a colonoscopy.   Upon arrival in the emergency department, she had an ultrasound that revealed sludge and some wall thickening up to 6 mm. Her common bile duct was 8-1/2 mm. She also had some heterogeneity to her liver. This is felt to possibly suggest cirrhosis based off of her ultrasound. Her LFTs are also elevated except for her total bilirubin. She also had a HIDA scan which was normal with the exception of a low ejection fraction of 7%. After admission, gastroenterology was consulted for further evaluation. She underwent an upper endoscopy today which was normal. Because of all her above findings we have been asked to evaluate the patient for further recommendations.   ROS please see history of present illness, otherwise all other systems have been reviewed and are negative.  History reviewed. No pertinent family history.  Past Medical History  Diagnosis Date  . Hypertension   . Diabetes mellitus     Past Surgical History  Procedure Laterality Date  . Kidney stone surgery    . Cataract extraction Bilateral   . Appendectomy      Social History:  reports that she has never smoked. She does not have any smokeless tobacco history on file. She reports that she does not drink alcohol or use  illicit drugs.  Allergies: No Known Allergies  Medications Prior to Admission  Medication Sig Dispense Refill  . glimepiride (AMARYL) 4 MG tablet Take 4 mg by mouth daily before breakfast.        . ibuprofen (ADVIL,MOTRIN) 100 MG/5ML suspension Take 200 mg by mouth daily as needed for fever or mild pain.      Marland Kitchen lisinopril-hydrochlorothiazide (PRINZIDE,ZESTORETIC) 20-25 MG per tablet Take 1 tablet by mouth daily.        . metFORMIN (GLUCOPHAGE) 1000 MG tablet Take 1,000 mg by mouth 2 (two) times daily with a meal.        . Multiple Vitamins-Minerals (MULTIVITAMINS THER. W/MINERALS) TABS Take 1 tablet by mouth daily. Centrum silver         Blood pressure 215/77, pulse 68, temperature 97.7 F (36.5 C), temperature source Oral, resp. rate 19, height 5' (1.524 m), weight 90 lb 9.6 oz (41.096 kg), SpO2 100.00%. Physical Exam: General: pleasant, frail appearing Hispanic female who is laying in bed in NAD HEENT: head is normocephalic, atraumatic.  Sclera are noninjected.  PERRL.  Ears and nose without any masses or lesions.  Mouth is pink and moist Heart: regular, rate, and rhythm with occasional PVCs.  Normal s1,s2. No obvious murmurs, gallops, or rubs noted.  Palpable radial and pedal pulses bilaterally Lungs: CTAB, no wheezes, rhonchi, or rales noted.  Respiratory effort nonlabored Abd: soft, minimally tender around her umbilicus, no right upper cautery tenderness, negative Murphy sign, ND, +BS, no masses, hernias, or organomegaly Psych: A&Ox3 with an appropriate affect.    Results for orders placed  during the hospital encounter of 01/11/14 (from the past 48 hour(s))  CBC WITH DIFFERENTIAL     Status: Abnormal   Collection Time    01/11/14  5:13 PM      Result Value Ref Range   WBC 10.7 (*) 4.0 - 10.5 K/uL   RBC 4.41  3.87 - 5.11 MIL/uL   Hemoglobin 13.0  12.0 - 15.0 g/dL   HCT 40.3  36.0 - 46.0 %   MCV 91.4  78.0 - 100.0 fL   MCH 29.5  26.0 - 34.0 pg   MCHC 32.3  30.0 - 36.0 g/dL    RDW 16.0 (*) 11.5 - 15.5 %   Platelets 178  150 - 400 K/uL   Neutrophils Relative % 92 (*) 43 - 77 %   Neutro Abs 9.8 (*) 1.7 - 7.7 K/uL   Lymphocytes Relative 5 (*) 12 - 46 %   Lymphs Abs 0.5 (*) 0.7 - 4.0 K/uL   Monocytes Relative 3  3 - 12 %   Monocytes Absolute 0.3  0.1 - 1.0 K/uL   Eosinophils Relative 0  0 - 5 %   Eosinophils Absolute 0.0  0.0 - 0.7 K/uL   Basophils Relative 0  0 - 1 %   Basophils Absolute 0.0  0.0 - 0.1 K/uL  COMPREHENSIVE METABOLIC PANEL     Status: Abnormal   Collection Time    01/11/14  5:13 PM      Result Value Ref Range   Sodium 138  137 - 147 mEq/L   Potassium 4.7  3.7 - 5.3 mEq/L   Chloride 100  96 - 112 mEq/L   CO2 24  19 - 32 mEq/L   Glucose, Bld 160 (*) 70 - 99 mg/dL   BUN 18  6 - 23 mg/dL   Creatinine, Ser 1.55 (*) 0.50 - 1.10 mg/dL   Calcium 9.2  8.4 - 10.5 mg/dL   Total Protein 7.4  6.0 - 8.3 g/dL   Albumin 3.0 (*) 3.5 - 5.2 g/dL   AST 458 (*) 0 - 37 U/L   ALT 199 (*) 0 - 35 U/L   Alkaline Phosphatase 277 (*) 39 - 117 U/L   Total Bilirubin 1.6 (*) 0.3 - 1.2 mg/dL   GFR calc non Af Amer 29 (*) >90 mL/min   GFR calc Af Amer 34 (*) >90 mL/min   Comment: (NOTE)     The eGFR has been calculated using the CKD EPI equation.     This calculation has not been validated in all clinical situations.     eGFR's persistently <90 mL/min signify possible Chronic Kidney     Disease.   Anion gap 14  5 - 15  LIPASE, BLOOD     Status: None   Collection Time    01/11/14  5:13 PM      Result Value Ref Range   Lipase 15  11 - 59 U/L  I-STAT TROPOININ, ED     Status: None   Collection Time    01/11/14  8:23 PM      Result Value Ref Range   Troponin i, poc 0.01  0.00 - 0.08 ng/mL   Comment 3            Comment: Due to the release kinetics of cTnI,     a negative result within the first hours     of the onset of symptoms does not rule out     myocardial infarction with certainty.  If myocardial infarction is still suspected,     repeat the test at  appropriate intervals.  I-STAT CG4 LACTIC ACID, ED     Status: Abnormal   Collection Time    01/11/14  8:25 PM      Result Value Ref Range   Lactic Acid, Venous 2.29 (*) 0.5 - 2.2 mmol/L  GLUCOSE, CAPILLARY     Status: Abnormal   Collection Time    01/11/14 11:05 PM      Result Value Ref Range   Glucose-Capillary 168 (*) 70 - 99 mg/dL  URINALYSIS, ROUTINE W REFLEX MICROSCOPIC     Status: Abnormal   Collection Time    01/12/14  4:26 AM      Result Value Ref Range   Color, Urine YELLOW  YELLOW   APPearance CLOUDY (*) CLEAR   Specific Gravity, Urine 1.012  1.005 - 1.030   pH 5.5  5.0 - 8.0   Glucose, UA NEGATIVE  NEGATIVE mg/dL   Hgb urine dipstick NEGATIVE  NEGATIVE   Bilirubin Urine NEGATIVE  NEGATIVE   Ketones, ur NEGATIVE  NEGATIVE mg/dL   Protein, ur NEGATIVE  NEGATIVE mg/dL   Urobilinogen, UA 1.0  0.0 - 1.0 mg/dL   Nitrite NEGATIVE  NEGATIVE   Leukocytes, UA TRACE (*) NEGATIVE  CBC     Status: Abnormal   Collection Time    01/12/14  4:26 AM      Result Value Ref Range   WBC 7.1  4.0 - 10.5 K/uL   RBC 4.00  3.87 - 5.11 MIL/uL   Hemoglobin 12.0  12.0 - 15.0 g/dL   HCT 37.5  36.0 - 46.0 %   MCV 93.8  78.0 - 100.0 fL   MCH 30.0  26.0 - 34.0 pg   MCHC 32.0  30.0 - 36.0 g/dL   RDW 16.3 (*) 11.5 - 15.5 %   Platelets 146 (*) 150 - 400 K/uL  COMPREHENSIVE METABOLIC PANEL     Status: Abnormal   Collection Time    01/12/14  4:26 AM      Result Value Ref Range   Sodium 137  137 - 147 mEq/L   Potassium 4.3  3.7 - 5.3 mEq/L   Chloride 103  96 - 112 mEq/L   CO2 21  19 - 32 mEq/L   Glucose, Bld 120 (*) 70 - 99 mg/dL   BUN 19  6 - 23 mg/dL   Creatinine, Ser 1.53 (*) 0.50 - 1.10 mg/dL   Calcium 9.0  8.4 - 10.5 mg/dL   Total Protein 6.3  6.0 - 8.3 g/dL   Albumin 2.5 (*) 3.5 - 5.2 g/dL   AST 234 (*) 0 - 37 U/L   ALT 139 (*) 0 - 35 U/L   Alkaline Phosphatase 217 (*) 39 - 117 U/L   Total Bilirubin 1.2  0.3 - 1.2 mg/dL   GFR calc non Af Amer 29 (*) >90 mL/min   GFR calc Af Amer  34 (*) >90 mL/min   Comment: (NOTE)     The eGFR has been calculated using the CKD EPI equation.     This calculation has not been validated in all clinical situations.     eGFR's persistently <90 mL/min signify possible Chronic Kidney     Disease.   Anion gap 13  5 - 15  URINE MICROSCOPIC-ADD ON     Status: Abnormal   Collection Time    01/12/14  4:26 AM      Result Value  Ref Range   Squamous Epithelial / LPF FEW (*) RARE   WBC, UA 3-6  <3 WBC/hpf   RBC / HPF 0-2  <3 RBC/hpf   Bacteria, UA RARE  RARE   Casts HYALINE CASTS (*) NEGATIVE   Crystals CA OXALATE CRYSTALS (*) NEGATIVE  GLUCOSE, CAPILLARY     Status: Abnormal   Collection Time    01/12/14  4:30 AM      Result Value Ref Range   Glucose-Capillary 115 (*) 70 - 99 mg/dL  GLUCOSE, CAPILLARY     Status: Abnormal   Collection Time    01/12/14  8:13 AM      Result Value Ref Range   Glucose-Capillary 111 (*) 70 - 99 mg/dL   Comment 1 Notify RN     Comment 2 Documented in Chart    GLUCOSE, CAPILLARY     Status: Abnormal   Collection Time    01/12/14 12:24 PM      Result Value Ref Range   Glucose-Capillary 121 (*) 70 - 99 mg/dL  HEPATITIS B SURFACE ANTIGEN     Status: None   Collection Time    01/12/14  3:37 PM      Result Value Ref Range   Hepatitis B Surface Ag NEGATIVE  NEGATIVE   Comment: Performed at [facility] ANTIBODY     Status: None   Collection Time    01/12/14  3:37 PM      Result Value Ref Range   Hep B S Ab NEGATIVE  NEGATIVE   Comment: Performed at [place] ANTIBODY     Status: None   Collection Time    01/12/14  3:37 PM      Result Value Ref Range   HCV Ab NEGATIVE  NEGATIVE   Comment: Performed at [place]     Status: None   Collection Time    01/12/14  3:37 PM      Result Value Ref Range   Prothrombin Time 14.7  11.6 - 15.2 seconds   INR 1.15  0.00 - 1.49  GLUCOSE, CAPILLARY     Status: None   Collection Time     01/12/14  4:50 PM      Result Value Ref Range   Glucose-Capillary 88  70 - 99 mg/dL  GLUCOSE, CAPILLARY     Status: None   Collection Time    01/12/14  8:35 PM      Result Value Ref Range   Glucose-Capillary 77  70 - 99 mg/dL  GLUCOSE, CAPILLARY     Status: Abnormal   Collection Time    01/13/14 12:10 AM      Result Value Ref Range   Glucose-Capillary 245 (*) 70 - 99 mg/dL  COMPREHENSIVE METABOLIC PANEL     Status: Abnormal   Collection Time    01/13/14  3:07 AM      Result Value Ref Range   Sodium 143  137 - 147 mEq/L   Potassium 3.0 (*) 3.7 - 5.3 mEq/L   Comment: DELTA CHECK NOTED     NO VISIBLE HEMOLYSIS   Chloride 109  96 - 112 mEq/L   CO2 23  19 - 32 mEq/L   Glucose, Bld 92  70 - 99 mg/dL   BUN 18  6 - 23 mg/dL   Creatinine, Ser 1.61 (*) 0.50 - 1.10 mg/dL   Calcium 8.2 (*) 8.4 - 10.5 mg/dL  Total Protein 5.3 (*) 6.0 - 8.3 g/dL   Albumin 2.1 (*) 3.5 - 5.2 g/dL   AST 72 (*) 0 - 37 U/L   ALT 68 (*) 0 - 35 U/L   Alkaline Phosphatase 156 (*) 39 - 117 U/L   Total Bilirubin 0.5  0.3 - 1.2 mg/dL   GFR calc non Af Amer 28 (*) >90 mL/min   GFR calc Af Amer 32 (*) >90 mL/min   Comment: (NOTE)     The eGFR has been calculated using the CKD EPI equation.     This calculation has not been validated in all clinical situations.     eGFR's persistently <90 mL/min signify possible Chronic Kidney     Disease.   Anion gap 11  5 - 15  GLUCOSE, CAPILLARY     Status: None   Collection Time    01/13/14  4:19 AM      Result Value Ref Range   Glucose-Capillary 80  70 - 99 mg/dL  GLUCOSE, CAPILLARY     Status: None   Collection Time    01/13/14 12:04 PM      Result Value Ref Range   Glucose-Capillary 92  70 - 99 mg/dL   Ct Head Wo Contrast  01/11/2014   CLINICAL DATA:  Weakness and confusion.  Falls.  EXAM: CT HEAD WITHOUT CONTRAST  TECHNIQUE: Contiguous axial images were obtained from the base of the skull through the vertex without intravenous contrast.  COMPARISON:  08/2012   FINDINGS: There is no evidence of acute cortical infarct, intracranial hemorrhage, mass, midline shift, or extra-axial fluid collection. Moderate cerebral atrophy is unchanged. Patchy and confluent hypodensities in the subcortical and periventricular white matter are similar to the prior study and compatible with moderate chronic small vessel ischemic disease.  Prior left cataract extraction is noted. Mastoid air cells and visualized paranasal sinuses are clear. Mild carotid siphon calcification is noted bilaterally.  IMPRESSION: No evidence of acute intracranial abnormality. Moderate chronic small vessel ischemic disease.   Electronically Signed   By: Logan Bores   On: 01/11/2014 18:55   US Abdomen Complete  01/11/2014   CLINICAL DATA:  Elevated liver function tests.  Abdominal pain.  EXAM: ULTRASOUND ABDOMEN COMPLETE  COMPARISON:  CT of the abdomen and pelvis without contrast 01/09/2011.  FINDINGS: Gallbladder:  There is some echogenic material lying dependently within the gallbladder which does not shadow, compatible with tumefactive sludge. Gallbladder appears moderately distended. Gallbladder wall is thickened at 6 mm. However, there is no pericholecystic fluid, and per report, the patient did not exhibit a sonographic Murphy's sign on examination.  Common bile duct:  Diameter: 8.5 mm in the porta hepatis.  Liver:  Hepatic echotexture is heterogeneous with multifocal areas of increased echogenicity and slightly nodular contour, suggestive of underlying cirrhosis. Mild prominence of intrahepatic bile ducts.  IVC:  No abnormality visualized.  Pancreas:  Unremarkable.  Spleen:  Size and appearance within normal limits.  6.6 cm in length.  Right Kidney:  Length: 9.2 cm. Echogenicity within normal limits. 3.1 x 1.8 x 1.2 cm anechoic lesion in the parapelvic aspect of the lower pole of the right kidney, compatible with parapelvic cyst. No hydronephrosis visualized.  Left Kidney:  Length: 10.3 cm. Echogenicity  within normal limits. In the interpolar region there is a 1.7 x 1.4 x 1.3 cm lesion that is generally anechoic with increased through transmission, but contains a thin internal septation, most compatible with a mildly complex cyst. No hydronephrosis visualized.  Abdominal aorta:  No aneurysm visualized.  Multiple atherosclerotic calcifications.  Other findings:  None.  IMPRESSION: 1. Tumefactive sludge in the gallbladder. The gallbladder wall is mildly thickened, and the gallbladder is moderately distended, however, per report, the patient did not exhibit a sonographic Murphy's sign on examination. Overall, findings are equivocal for acute cholecystitis and clinical correlation is recommended. It is worth noting, however, that the gallbladder wall appeared markedly thickened on remote prior CT examination 01/09/2011, which suggests at this may be chronic in this individual. 2. Mild intra and extrahepatic biliary ductal dilatation. The possibility of sludge in the common bile duct should be considered. This could be further evaluated with MRCP if clinically appropriate. 3. Nodular liver contour and heterogeneous echotexture of the liver parenchyma, suggestive of cirrhosis. 4. Additional findings, as above.   Electronically Signed   By: Vinnie Langton M.D.   On: 01/11/2014 20:08   Nm Hepato W/eject Fract  01/13/2014   CLINICAL DATA:  Abdominal pain  EXAM: NUCLEAR MEDICINE HEPATOBILIARY IMAGING WITH GALLBLADDER EF  TECHNIQUE: Sequential images of the abdomen were obtained out to 60 minutes following intravenous administration of radiopharmaceutical. After slow intravenous infusion of 0.8 micrograms Cholecystokinin, gallbladder ejection fraction was determined.  RADIOPHARMACEUTICALS:  5.0 [employer] IN-86V Choletec  COMPARISON:  Ultrasound 01/11/2014.  FINDINGS: There is normal uptake of the tracer by the liver. CBD visualized at 30 min. Gallbladder visualized at 75 min. Post CCK gallbladder ejection fraction  -7.7%. At 30 min, normal ejection fraction is greater than 30%.  The patient did not experience symptoms during CCK infusion.  IMPRESSION: No cystic duct obstruction. Gallbladder ejection fraction is only -7.7%. Normal gallbladder ejection fraction should be greater than 30%.   Electronically Signed   By: Lahoma Crocker M.D.   On: 01/13/2014 12:10   Dg Abd Acute W/chest  01/11/2014   CLINICAL DATA:  Abdominal pain.  EXAM: ACUTE ABDOMEN SERIES (ABDOMEN 2 VIEW & CHEST 1 VIEW)  COMPARISON:  Chest x-ray 12/10/2011  FINDINGS: Heart is normal size. Tortuous, calcified aorta. Lungs are clear. No effusions.  Gas within nondistended large and small bowel. No evidence of free air, organomegaly or suspicious calcification. Moderate stool burden in the colon.  No acute bony abnormality.  IMPRESSION: No evidence of bowel obstruction or free air. Moderate stool burden.  No acute cardiopulmonary disease.   Electronically Signed   By: Rolm Baptise M.D.   On: 01/11/2014 19:08       Assessment/Plan 1. Periumbilical abdominal pain 2. Elevated LFTs 3. Gallbladder sludge 4. Decreased gallbladder ejection fraction 5. Uncontrolled hypertension 6. Diabetes mellitus 7. Intermittent hematemesis  Plan: 1. After review of her history, physical exam, labs, and diagnostic study, it is not clear that the source of her abdominal pain and other findings is definitively related to her gallbladder. We agree with MRCP to further evaluate the biliary tree. Her ultrasound is suggestive of cirrhosis which sometimes gives similar findings. We will await further workup and follow along. No definite acute indications for surgical intervention at this time.  Aravind Chrismer E 01/13/2014, 1:15 PM Pager: [phone]

## 2014-01-14 ENCOUNTER — Encounter (HOSPITAL_COMMUNITY): Payer: Self-pay | Admitting: Gastroenterology

## 2014-01-14 DIAGNOSIS — K8051 Calculus of bile duct without cholangitis or cholecystitis with obstruction: Principal | ICD-10-CM

## 2014-01-14 LAB — CBC
HCT: 32.1 % — ABNORMAL LOW (ref 36.0–46.0)
HEMOGLOBIN: 10.3 g/dL — AB (ref 12.0–15.0)
MCH: 29.4 pg (ref 26.0–34.0)
MCHC: 32.1 g/dL (ref 30.0–36.0)
MCV: 91.7 fL (ref 78.0–100.0)
Platelets: 164 10*3/uL (ref 150–400)
RBC: 3.5 MIL/uL — ABNORMAL LOW (ref 3.87–5.11)
RDW: 16.5 % — ABNORMAL HIGH (ref 11.5–15.5)
WBC: 3.5 10*3/uL — ABNORMAL LOW (ref 4.0–10.5)

## 2014-01-14 LAB — COMPREHENSIVE METABOLIC PANEL
ALT: 46 U/L — AB (ref 0–35)
AST: 37 U/L (ref 0–37)
Albumin: 2.3 g/dL — ABNORMAL LOW (ref 3.5–5.2)
Alkaline Phosphatase: 152 U/L — ABNORMAL HIGH (ref 39–117)
Anion gap: 16 — ABNORMAL HIGH (ref 5–15)
BUN: 13 mg/dL (ref 6–23)
CHLORIDE: 109 meq/L (ref 96–112)
CO2: 18 mEq/L — ABNORMAL LOW (ref 19–32)
Calcium: 8.4 mg/dL (ref 8.4–10.5)
Creatinine, Ser: 1.61 mg/dL — ABNORMAL HIGH (ref 0.50–1.10)
GFR calc non Af Amer: 28 mL/min — ABNORMAL LOW (ref >90)
GFR, EST AFRICAN AMERICAN: 32 mL/min — AB (ref >90)
GLUCOSE: 69 mg/dL — AB (ref 70–99)
POTASSIUM: 4.1 meq/L (ref 3.7–5.3)
Sodium: 143 mEq/L (ref 137–147)
TOTAL PROTEIN: 5.7 g/dL — AB (ref 6.0–8.3)
Total Bilirubin: 0.7 mg/dL (ref 0.3–1.2)

## 2014-01-14 LAB — GLUCOSE, CAPILLARY
GLUCOSE-CAPILLARY: 119 mg/dL — AB (ref 70–99)
Glucose-Capillary: 100 mg/dL — ABNORMAL HIGH (ref 70–99)
Glucose-Capillary: 115 mg/dL — ABNORMAL HIGH (ref 70–99)
Glucose-Capillary: 208 mg/dL — ABNORMAL HIGH (ref 70–99)
Glucose-Capillary: 81 mg/dL (ref 70–99)
Glucose-Capillary: 85 mg/dL (ref 70–99)

## 2014-01-14 LAB — PROTIME-INR
INR: 0.99 (ref 0.00–1.49)
Prothrombin Time: 13.1 seconds (ref 11.6–15.2)

## 2014-01-14 NOTE — Progress Notes (Signed)
Daily Rounding Note  01/14/2014, 9:04 AM  LOS: 3 days   SUBJECTIVE:       Was refusing MRCP last night due to frustration with being NPO.  Eventually agreed to the study and now on clears.  No abdominal pain or nausea or vomiting today  OBJECTIVE:         Vital signs in last 24 hours:    Temp:  [97.7 F (36.5 C)-98.1 F (36.7 C)] 97.9 F (36.6 C) (07/23 0419) Pulse Rate:  [60-88] 88 (07/23 0419) Resp:  [16-23] 16 (07/23 0419) BP: (116-254)/(54-188) 155/77 mmHg (07/23 0419) SpO2:  [96 %-100 %] 96 % (07/23 0419) Last BM Date: 01/12/14 General: thin, aged, not toxic   Heart: RRR Chest: clear bil.  Abdomen: soft, NT, ND.  Active BS  Extremities: no CCE Neuro/Psych:  Appropriate, follows commands.  Sits up with minimal assist.   Intake/Output from previous day: 07/22 0701 - 07/23 0700 In: 625 [I.V.:625] Out: 300 [Urine:300]  Intake/Output this shift:    Lab Results:  Recent Labs  01/11/14 1713 01/12/14 0426  WBC 10.7* 7.1  HGB 13.0 12.0  HCT 40.3 37.5  PLT 178 146*   BMET  Recent Labs  01/11/14 1713 01/12/14 0426 01/13/14 0307  NA 138 137 143  K 4.7 4.3 3.0*  CL 100 103 109  CO2 24 21 23   GLUCOSE 160* 120* 92  BUN 18 19 18   CREATININE 1.55* 1.53* 1.61*  CALCIUM 9.2 9.0 8.2*   LFT  Recent Labs  01/11/14 1713 01/12/14 0426 01/13/14 0307  PROT 7.4 6.3 5.3*  ALBUMIN 3.0* 2.5* 2.1*  AST 458* 234* 72*  ALT 199* 139* 68*  ALKPHOS 277* 217* 156*  BILITOT 1.6* 1.2 0.5   PT/INR  Recent Labs  01/12/14 1537  LABPROT 14.7  INR 1.15   Hepatitis Panel  Recent Labs  01/12/14 1537  HEPBSAG NEGATIVE  HCVAB NEGATIVE    Studies/Results: Mr Abdomen Mrcp Wo Cm Mr 3d Recon At Scanner 01/14/2014  COMPARISON:  Ultrasound 01/11/2014, HIDA scan 01/13/2014, CT 01/09/2011  FINDINGS: There is mild intrahepatic biliary duct dilatation. There is significant dilatation of common hepatic duct and  common bile duct. The common bile duct measures 14 mm. There is a large calculus within the distal common bile duct measuring 14 mm seen on coronal series 8, image 15. There is no significant pancreatic duct dilatation. The gallbladder is distended to 5.4 cm. There is echogenic sludge versus small stones layering within the gallbladder. No significant pericholecystic fluid.  There is no focal hepatic. The pancreatic parenchyma is normal. The spleen adrenal glands normal. There is a cysts within the cortex of left and right kidneys which likely benign. No periportal retroperitoneal adenopathy. No pleural fluid.  IMPRESSION: 1. Large calculus within the distal common bile duct with evidence of biliary obstruction with moderate extrahepatic biliary duct dilatation and mild intrahepatic biliary duct dilatation. 2. The gallbladder is distended with sludge which is also likely related to the obstruction. Evidence of chronic cholecystitis on HIDA scan 3. Biliary obstruction is partial as demonstrated on HIDA of 01/13/2014.   Electronically Signed   By: Suzy Bouchard M.D.   On: 01/14/2014 08:29   Nm Hepato W/eject Fract 01/13/2014  COMPARISON:  Ultrasound 01/11/2014.  FINDINGS: There is normal uptake of the tracer by the liver. CBD visualized at 30 min. Gallbladder visualized at 75 min. Post CCK gallbladder ejection fraction -7.7%. At 30 min, normal ejection  fraction is greater than 30%.  The patient did not experience symptoms during CCK infusion.  IMPRESSION: No cystic duct obstruction. Gallbladder ejection fraction is only -7.7%. Normal gallbladder ejection fraction should be greater than 30%.   Electronically Signed   By: Lahoma Crocker M.D.   On: 01/13/2014 12:10    ASSESMENT:   *  Choledocholithiasis. *  Low GB EF.  *  Vomiting/regurgitation, ? dysphagia. Has not been able to complete SLP study due to multiple off floor tests.  May be better served by an esophagram.  EGD 7/22: small HH, o/w normal.  *   Cirrhotic appearing liver on ultrasound 7/20 *  Hypokalemia.    PLAN   *  Needs ERCP.  Plan for tomorrow. Need to speak with pt and family when translator is present.  *  Let her eat soft diet today.  *  Await further surgical decisions.     Azucena Freed  01/14/2014, 9:04 AM Pager: [phone]  [facility] Attending  I have also seen and assessed the patient and agree with the above note. She does need an ERCP for choledocholithiasis. I tried explaining through language line but hearing difficulties would not allow adequate discussion. My PA will discuss with an in-person translator The patient has indicated she feels well and would like to go home - i have advised she needs ERCP and stone extraction  Gatha Mayer, MD, [facility] [phone] (pager) 01/14/2014 1:40 PM

## 2014-01-14 NOTE — Progress Notes (Signed)
I have seen and examined the pt and agree with PA-Jenning's progress note.  

## 2014-01-14 NOTE — Progress Notes (Signed)
Speech Language Pathology Treatment: Dysphagia  Patient Details Name: Tammy Willis MRN: [id number] DOB: June 19, 1925 Today's Date: 01/14/2014 Time: 0254-2706 SLP Time Calculation (min): 19 min  Assessment / Plan / Recommendation Clinical Impression  Pt currently on a clear liquid diet, observed to tolerate this very well without nausea or regurgitation. EGD report was normal. Given pts report of need to vomit after consuming solids suspect this is either due to gall bladder problems or an esophageal dysmotility. GI PA recommends starting a soft diet today, but suspect pt will continue to struggle with it. Is she were able to try liquid nutritional supplements she may have better intake. SLP offered basic compensatory strategies (in spanish) for esophageal dysphagia for when/if solids are initiated. At this point given there there is no oral or oropharyngeal dysphagia to address and education is complete, will sign off.     HPI HPI: Tammy Willis is a 80 y.o. female with PMH of DM2, HTN, appendectomy, presents to ED with son (who interprets for patient as she speaks spanish only) with c/o abdominal pain and lethargy. Yesterday patient complained of abdominal pain. No fever, does report N/V with "every meal she eats" for the past 2-3 months or so. Also has worse symptoms with spicy foods. Pain improved after onset yesterday but today patient feels much more weak, off balance, and having difficulty walking at home. Patient does typically walk but needs help with ADLs, family suspects she may have some dementia (although not formally diagnosed) but mental status was worse this morning (described as "talking to dead relatives").    Pertinent Vitals NA  SLP Plan  Discharge SLP treatment due to (comment)    Recommendations Diet recommendations: Thin liquid Liquids provided via: Cup Medication Administration: Crushed with puree Supervision: Patient able to self feed Compensations: Slow  rate;Small sips/bites;Follow solids with liquid Postural Changes and/or Swallow Maneuvers: Seated upright 90 degrees              Oral Care Recommendations: Oral care BID Follow up Recommendations: None Plan: Discharge SLP treatment due to (comment)    GO    Herbie Baltimore, MA CCC-SLP [phone]  Tammy Willis, [name] 01/14/2014, 9:50 AM

## 2014-01-14 NOTE — Progress Notes (Signed)
[facility] Progress Note   Jazzmyn Filion BWG:[id number] DOB: Nov 07, 1924 DOA: 01/11/2014 PCP: Pcp Not In System  Brief narrative: Tammy Willis is a 80 y.o. female  with type 2 diabetes mellitus, hypertension who presents with abdominal pain. She's been having pain after eating for a couple of months now. She decided to come to the hospital due to significant weakness. In the ER she was found to have an ultrasound which was negative for an acute cholecystitis. LFTs are elevated-  alkaline phosphatase is 217. She waas found to have sludge in the [place]. HIDA scan abnormal, she underwent EGD which showed a small hiatal hernia. Surgery consulted and MRCP ordered to evaluate for biliary cholecystitis. But patient refused the MRCP yesterday. Plan for ERCP IN am.      Abdominal pain, other specified site--  Nausea with vomiting-dysphagia-- sludge in gallbladder -Difficult to tell if this is related to a chronic cholecystitis versus gastritis or PUD -GI has evaluated her and have ordered a HIDA scan whch was abnormal . EGD showed small hiatal hernia.  -SLP has evaluated her and final problem with pharyngeal part of swallowing - plan for ERCP in am.    Active Problems: Cirrhosis of the liver? Ultrasound reveals nodular contour and heterogeneous areas of increased echogenicity -Hepatitis panel ordered by GI and is negative.   CKD 3 versus acute renal failure -Last BUN and creatinine that we have is from March 2013 when BUN was 20 and creatinine 1.06 -She she is receiving normal saline at 75 cc an hour-follow creatinine with hydration    Diabetes mellitus -Low dose sliding scale insulin-hold metformin    Accelerated  HTN (hypertension) -Hold lisinopril/HCTZ-IV hydralazine. And repeat BP in one hour.  Mild Thrombocytopenia - recheck in AM, shows improvement.   Elevated lactic acid - this was mild- cont to hydrate  Hypokalemia: - replete as needed.     Code Status:  full code  Family Communication: with daughter and son-in-law using interpretor Disposition Plan: to be determined   Consultants:  GI  Surgery  Procedures:  none  Antibiotics: Anti-infectives   Start     Dose/Rate Route Frequency Ordered Stop   01/11/14 2115  ciprofloxacin (CIPRO) IVPB 400 mg  Status:  Discontinued     400 mg 200 mL/hr over 60 Minutes Intravenous Every 12 hours 01/11/14 2102 01/12/14 1533   01/11/14 2115  metroNIDAZOLE (FLAGYL) IVPB 500 mg  Status:  Discontinued     500 mg 100 mL/hr over 60 Minutes Intravenous Every 8 hours 01/11/14 2102 01/12/14 1533       DVT prophylaxis:  heparin   Objective: Filed Weights   01/11/14 2319 01/12/14 2041  Weight: 39.4 kg (86 lb 13.8 oz) 41.096 kg (90 lb 9.6 oz)    Intake/Output Summary (Last 24 hours) at 01/14/14 1539 Last data filed at 01/14/14 1300  Gross per 24 hour  Intake    540 ml  Output    100 ml  Net    440 ml     Vitals Filed Vitals:   01/13/14 1902 01/13/14 2144 01/14/14 0419 01/14/14 1010  BP: 197/86 116/73 155/77 171/66  Pulse:  80 88 86  Temp:  98.1 F (36.7 C) 97.9 F (36.6 C) 98.2 F (36.8 C)  TempSrc:  Oral Oral Oral  Resp:  16 16 20   Height:      Weight:      SpO2:  98% 96% 97%    Exam: General: No acute respiratory distress-awake alert oriented x3  Lungs: Clear to auscultation bilaterally without wheezes or crackles Cardiovascular: Regular rate and rhythm without murmur gallop or rub normal S1 and S2 Abdomen: mildly tender in epigastrium , nondistended, soft, bowel sounds positive, no rebound, no ascites, no appreciable mass Extremities: No significant cyanosis, clubbing, or edema bilateral lower extremities  Data Reviewed: Basic Metabolic Panel:  Recent Labs Lab 01/11/14 1713 01/12/14 0426 01/13/14 0307 01/14/14 0922  NA 138 137 143 143  K 4.7 4.3 3.0* 4.1  CL 100 103 109 109  CO2 24 21 23  18*  GLUCOSE 160* 120* 92 69*  BUN 18 19 18 13   CREATININE 1.55* 1.53* 1.61*  1.61*  CALCIUM 9.2 9.0 8.2* 8.4   Liver Function Tests:  Recent Labs Lab 01/11/14 1713 01/12/14 0426 01/13/14 0307 01/14/14 0922  AST 458* 234* 72* 37  ALT 199* 139* 68* 46*  ALKPHOS 277* 217* 156* 152*  BILITOT 1.6* 1.2 0.5 0.7  PROT 7.4 6.3 5.3* 5.7*  ALBUMIN 3.0* 2.5* 2.1* 2.3*    Recent Labs Lab 01/11/14 1713  LIPASE 15   No results found for this basename: AMMONIA,  in the last 168 hours CBC:  Recent Labs Lab 01/11/14 1713 01/12/14 0426 01/14/14 0922  WBC 10.7* 7.1 3.5*  NEUTROABS 9.8*  --   --   HGB 13.0 12.0 10.3*  HCT 40.3 37.5 32.1*  MCV 91.4 93.8 91.7  PLT 178 146* 164   Cardiac Enzymes: No results found for this basename: CKTOTAL, CKMB, CKMBINDEX, TROPONINI,  in the last 168 hours BNP (last 3 results) No results found for this basename: PROBNP,  in the last 8760 hours CBG:  Recent Labs Lab 01/13/14 2141 01/14/14 0009 01/14/14 0415 01/14/14 0748 01/14/14 1219  GLUCAP 95 100* 85 81 115*    No results found for this or any previous visit (from the past 240 hour(s)).   Studies:  Recent x-ray studies have been reviewed in detail by the Attending Physician  Scheduled Meds:  Scheduled Meds: . feeding supplement (RESOURCE BREEZE)  1 Container Oral Q2000  . insulin aspart  0-9 Units Subcutaneous 6 times per day   Continuous Infusions:    Time spent on care of this patient: >35 min   Shi Blankenship, MD 01/14/2014, 3:39 PM  LOS: 3 days   [facility] Office  [phone] Pager - Text Page per [email or web address]  If 7PM-7AM, please contact night-coverage [email or web address]

## 2014-01-14 NOTE — Progress Notes (Signed)
Physical Therapy Treatment/ Discharge Patient Details Name: Tammy Willis MRN: [id number] DOB: 1925-03-12 Today's Date: 01/14/2014    History of Present Illness Tammy Willis is a 80 y.o. female with PMH of DM2, HTN, appendectomy, presents with c/o abdominal pain and lethargy 7/20 patient felt  weak, off balance, and having difficulty walking at home. Patient does typically walk but needs help with ADLs, family suspects she may have some dementia (although not formally diagnosed). Pt with cholecystitis and hiatal hernia    PT Comments    Pt pleasant but very HOH and with use of interpreter phone pt having great difficulty communicating. Dgtr present and in room and answering questions at times but not consistent with answers. She relays that pt is usually home on the couch and she may have her move to sister's house where she can walk outside. Pt and dgtr both confirm that pt is back to baseline physical function and agreeable to no further acute needs. Pt encouraged to mobilize and be OOB daily with nursing staff. Gastroenterology present for part of session to relay information to pt with increased static standing for discussion. Pt reports no pain, LOB or difficulty. Pt has met goals and will have necessary assist at home. Signing off.   Follow Up Recommendations  No PT follow up;Supervision for mobility/OOB     Equipment Recommendations  None recommended by PT    Recommendations for Other Services       Precautions / Restrictions Precautions Precautions: Fall    Mobility  Bed Mobility Overal bed mobility: Needs Assistance Bed Mobility: Supine to Sit     Supine to sit: Supervision     General bed mobility comments: cues for hand placement as pt reaching out for assist to be pulled up but able to perform on her own  Transfers     Transfers: Sit to/from Stand Sit to Stand: Modified independent (Device/Increase time)             Ambulation/Gait Ambulation/Gait assistance: Supervision Ambulation Distance (Feet): 300 Feet Assistive device: Rolling walker (2 wheeled);None Gait Pattern/deviations: Step-through pattern;Decreased stride length   Gait velocity interpretation: Below normal speed for age/gender General Gait Details: attempted walking 120' with RW but pt would not follow cues to stay close to RW, removed RW and pt able to walk with HHA as well as no DME the rest of ambulation with slow steady gait and family reports pt typically uses a cane   Stairs Stairs: Yes Stairs assistance: Supervision Stair Management: Step to pattern;Sideways;One rail Left Number of Stairs: 5 General stair comments: Pt able to ascend steps with bil hands on RW and supervisionfor lines and safety  Wheelchair Mobility    Modified Rankin (Stroke Patients Only)       Balance           Standing balance support:  (Pt stood 10 min with bil UE support in static stand ) Standing balance-Leahy Scale: Good                      Cognition Arousal/Alertness: Awake/alert Behavior During Therapy: Flat affect Overall Cognitive Status: History of cognitive impairments - at baseline (pt unable to state day and difficulty following commands but unclear if due to [facility])                      Exercises      General Comments        Pertinent Vitals/Pain No pain  Home Living                      Prior Function            PT Goals (current goals can now be found in the care plan section) Progress towards PT goals: Goals met/education completed, patient discharged from PT    Frequency       PT Plan Discharge plan needs to be updated    Co-evaluation             End of Session   Activity Tolerance: Patient tolerated treatment well Patient left: in chair;with call bell/phone within reach;with family/visitor present     Time: 4709-2957 PT Time Calculation (min): 40 min  Charges:   $Gait Training: 8-22 mins $Therapeutic Activity: 23-37 mins                    G Codes:      Melford Aase Jan 26, 2014, 1:30 PM Elwyn Reach, [place]

## 2014-01-14 NOTE — Progress Notes (Signed)
1 Day Post-Op  Subjective: Pt is still NPO, and currently is only complaining of pain at her IV site.  No nausea, or vomiting.  Objective: Vital signs in last 24 hours: Temp:  [97.7 F (36.5 C)-98.1 F (36.7 C)] 97.9 F (36.6 C) (07/23 0419) Pulse Rate:  [60-88] 88 (07/23 0419) Resp:  [16-23] 16 (07/23 0419) BP: (116-254)/(54-188) 155/77 mmHg (07/23 0419) SpO2:  [96 %-100 %] 96 % (07/23 0419) Last BM Date: 01/12/14  Intake/Output from previous day: 07/22 0701 - 07/23 0700 In: 625 [I.V.:625] Out: 300 [Urine:300] Intake/Output this shift:    General appearance: alert, cooperative, no distress and frail elderly female GI: soft, non-tender; bowel sounds normal; no masses,  no organomegaly  Lab Results:   Recent Labs  01/11/14 1713 01/12/14 0426  WBC 10.7* 7.1  HGB 13.0 12.0  HCT 40.3 37.5  PLT 178 146*    BMET  Recent Labs  01/12/14 0426 01/13/14 0307  NA 137 143  K 4.3 3.0*  CL 103 109  CO2 21 23  GLUCOSE 120* 92  BUN 19 18  CREATININE 1.53* 1.61*  CALCIUM 9.0 8.2*   PT/INR  Recent Labs  01/12/14 1537  LABPROT 14.7  INR 1.15     Recent Labs Lab 01/11/14 1713 01/12/14 0426 01/13/14 0307  AST 458* 234* 72*  ALT 199* 139* 68*  ALKPHOS 277* 217* 156*  BILITOT 1.6* 1.2 0.5  PROT 7.4 6.3 5.3*  ALBUMIN 3.0* 2.5* 2.1*     Lipase     Component Value Date/Time   LIPASE 15 01/11/2014 1713     Studies/Results: Nm Hepato W/eject Fract  01/13/2014   CLINICAL DATA:  Abdominal pain  EXAM: NUCLEAR MEDICINE HEPATOBILIARY IMAGING WITH GALLBLADDER EF  TECHNIQUE: Sequential images of the abdomen were obtained out to 60 minutes following intravenous administration of radiopharmaceutical. After slow intravenous infusion of 0.8 micrograms Cholecystokinin, gallbladder ejection fraction was determined.  RADIOPHARMACEUTICALS:  5.0 Millicurie AS-50N Choletec  COMPARISON:  Ultrasound 01/11/2014.  FINDINGS: There is normal uptake of the tracer by the liver. CBD  visualized at 30 min. Gallbladder visualized at 75 min. Post CCK gallbladder ejection fraction -7.7%. At 30 min, normal ejection fraction is greater than 30%.  The patient did not experience symptoms during CCK infusion.  IMPRESSION: No cystic duct obstruction. Gallbladder ejection fraction is only -7.7%. Normal gallbladder ejection fraction should be greater than 30%.   Electronically Signed   By: Lahoma Crocker M.D.   On: 01/13/2014 12:10    Medications: . feeding supplement (RESOURCE BREEZE)  1 Container Oral Q2000  . insulin aspart  0-9 Units Subcutaneous 6 times per day  . pneumococcal 23 valent vaccine  0.5 mL Intramuscular Tomorrow-1000    Assessment/Plan Periumbilical abdominal pain, nausea and vomiting/hematemesis Gallbladder sludge, and CBD dilitation Normal Hida scan, but low EF 7% Elevated LFT's, Improving -  (Hepatititis panel is negative) EGD 01/13/14 Dr. Lucio Edward:  Small hiatal hernia Stage III renal disease  Hyp[ertension    Plan:  MRCP pending, No labs today.  We will await MRCP results, and recheck labs.  Not on anticoagulation, add SCT for DVT prophylaxis.   LOS: 3 days    Adyan Palau 01/14/2014

## 2014-01-15 ENCOUNTER — Encounter (HOSPITAL_COMMUNITY): Payer: Self-pay

## 2014-01-15 ENCOUNTER — Encounter (HOSPITAL_COMMUNITY)
Admission: EM | Disposition: A | Discharge status: Home Health | Payer: Self-pay | Source: Home / Self Care | Attending: Internal Medicine

## 2014-01-15 ENCOUNTER — Encounter (HOSPITAL_COMMUNITY): Payer: Medicaid Other | Admitting: Certified Registered Nurse Anesthetist

## 2014-01-15 ENCOUNTER — Inpatient Hospital Stay (HOSPITAL_COMMUNITY): Payer: Medicaid Other | Admitting: Certified Registered Nurse Anesthetist

## 2014-01-15 LAB — COMPREHENSIVE METABOLIC PANEL
ALK PHOS: 151 U/L — AB (ref 39–117)
ALT: 34 U/L (ref 0–35)
AST: 31 U/L (ref 0–37)
Albumin: 2.3 g/dL — ABNORMAL LOW (ref 3.5–5.2)
Anion gap: 14 (ref 5–15)
BUN: 12 mg/dL (ref 6–23)
CO2: 20 mEq/L (ref 19–32)
Calcium: 8.5 mg/dL (ref 8.4–10.5)
Chloride: 109 mEq/L (ref 96–112)
Creatinine, Ser: 1.62 mg/dL — ABNORMAL HIGH (ref 0.50–1.10)
GFR calc Af Amer: 32 mL/min — ABNORMAL LOW (ref >90)
GFR calc non Af Amer: 27 mL/min — ABNORMAL LOW (ref >90)
Glucose, Bld: 78 mg/dL (ref 70–99)
POTASSIUM: 3.9 meq/L (ref 3.7–5.3)
Sodium: 143 mEq/L (ref 137–147)
Total Bilirubin: 0.5 mg/dL (ref 0.3–1.2)
Total Protein: 5.4 g/dL — ABNORMAL LOW (ref 6.0–8.3)

## 2014-01-15 LAB — GLUCOSE, CAPILLARY
GLUCOSE-CAPILLARY: 101 mg/dL — AB (ref 70–99)
GLUCOSE-CAPILLARY: 122 mg/dL — AB (ref 70–99)
GLUCOSE-CAPILLARY: 37 mg/dL — AB (ref 70–99)
GLUCOSE-CAPILLARY: 61 mg/dL — AB (ref 70–99)
GLUCOSE-CAPILLARY: 67 mg/dL — AB (ref 70–99)
Glucose-Capillary: 102 mg/dL — ABNORMAL HIGH (ref 70–99)
Glucose-Capillary: 123 mg/dL — ABNORMAL HIGH (ref 70–99)
Glucose-Capillary: 126 mg/dL — ABNORMAL HIGH (ref 70–99)
Glucose-Capillary: 149 mg/dL — ABNORMAL HIGH (ref 70–99)
Glucose-Capillary: 318 mg/dL — ABNORMAL HIGH (ref 70–99)

## 2014-01-15 SURGERY — CANCELLED PROCEDURE
Anesthesia: Monitor Anesthesia Care

## 2014-01-15 MED ORDER — GLUCOSE 40 % PO GEL
ORAL | Status: AC
  Administered 2014-01-15: 37.5 g
  Filled 2014-01-15: qty 1

## 2014-01-15 MED ORDER — DEXTROSE 50 % IV SOLN
INTRAVENOUS | Status: AC
  Filled 2014-01-15: qty 50

## 2014-01-15 MED ORDER — LACTATED RINGERS IV SOLN
INTRAVENOUS | Status: DC
  Administered 2014-01-15: 10:00:00 via INTRAVENOUS

## 2014-01-15 MED ORDER — GLUCOSE 40 % PO GEL
1.0000 | ORAL | Status: DC | PRN
Start: 2014-01-15 — End: 2014-01-19
  Filled 2014-01-15: qty 1

## 2014-01-15 MED ORDER — FENTANYL CITRATE 0.05 MG/ML IJ SOLN: 25.0000 ug | INTRAMUSCULAR | Status: DC | PRN

## 2014-01-15 MED ORDER — SODIUM CHLORIDE 0.9 % IV SOLN: INTRAVENOUS | Status: DC

## 2014-01-15 MED ORDER — DEXTROSE 50 % IV SOLN: 25.0000 mL | Freq: Once | INTRAVENOUS | Status: AC | PRN

## 2014-01-15 MED ORDER — HYDRALAZINE HCL 25 MG PO TABS
25.0000 mg | ORAL_TABLET | Freq: Three times a day (TID) | ORAL | Status: DC
  Administered 2014-01-15 (×2): 25 mg via ORAL
  Filled 2014-01-15 (×8): qty 1

## 2014-01-15 MED ORDER — AMLODIPINE BESYLATE 10 MG PO TABS
10.0000 mg | ORAL_TABLET | Freq: Every day | ORAL | Status: DC
  Administered 2014-01-15 – 2014-01-18 (×4): 10 mg via ORAL
  Filled 2014-01-15 (×6): qty 1

## 2014-01-15 MED ORDER — SODIUM CHLORIDE 0.9 % IV SOLN
3.0000 g | Freq: Once | INTRAVENOUS | Status: DC
  Filled 2014-01-15: qty 3

## 2014-01-15 NOTE — Progress Notes (Signed)
Hypoglycemic Event  CBG:67 Treatment: glucose gel  Symptoms: None  Follow-up CBG: [id number] CBG Result:126 Possible Reasons for Event: Inadequate meal intake  Comments/MD notified:per hypoglycemia protocol    Tammy Willis Joselita  Remember to initiate Hypoglycemia Order Set & complete

## 2014-01-15 NOTE — Progress Notes (Addendum)
    BP very high pre-ERCP In consultation with anesthesia have decided to cancel ERCP. Needs better control of BP - is off regular meds  Discussed w/ Dr. Karleen Hampshire  We will regroup over weekend Patient is asx now and is at best ambivalent about procedures/surgery so it may be that we explain risks/benefits Tx or no Tx again and not do procedure  Discussed w/ Dr. Shea Stakes, MD, [facility] [phone] (pager) 01/15/2014 11:32 AM

## 2014-01-15 NOTE — Anesthesia Preprocedure Evaluation (Signed)
Anesthesia Evaluation  Patient identified by MRN, date of birth, ID band Patient awake    Reviewed: Allergy & Precautions, H&P , NPO status , Patient's Chart, lab work & pertinent test results  Airway Mallampati: II      Dental   Pulmonary neg pulmonary ROS,          Cardiovascular hypertension,     Neuro/Psych    GI/Hepatic negative GI ROS, Neg liver ROS,   Endo/Other  diabetes  Renal/GU Renal disease     Musculoskeletal   Abdominal   Peds  Hematology   Anesthesia Other Findings   Reproductive/Obstetrics                           Anesthesia Physical Anesthesia Plan  ASA: III  Anesthesia Plan: MAC   Post-op Pain Management:    Induction: Intravenous  Airway Management Planned: Simple Face Mask  Additional Equipment:   Intra-op Plan:   Post-operative Plan:   Informed Consent: I have reviewed the patients History and Physical, chart, labs and discussed the procedure including the risks, benefits and alternatives for the proposed anesthesia with the patient or authorized representative who has indicated his/her understanding and acceptance.   Dental advisory given  Plan Discussed with: CRNA, Anesthesiologist and Surgeon  Anesthesia Plan Comments:         Anesthesia Quick Evaluation

## 2014-01-15 NOTE — Progress Notes (Signed)
I have seen and examined the pt and agree with PA-Riebock's progress note. Pt with no s/s of obstruction.  No plans for surgery call with questions.

## 2014-01-15 NOTE — Progress Notes (Signed)
[facility] Progress Note   Tammy Willis MWN:[id number] DOB: [birth year] DOA: 01/11/2014 PCP: Pcp Not In System  Brief narrative: Tammy Willis is a 80 y.o. female  with type 2 diabetes mellitus, hypertension who presents with abdominal pain. She's been having pain after eating for a couple of months now. She decided to come to the hospital due to significant weakness. In the ER she was found to have an ultrasound which was negative for an acute cholecystitis. LFTs are elevated-  alkaline phosphatase is 217. She waas found to have sludge in the [place]. HIDA scan abnormal, she underwent EGD which showed a small hiatal hernia. Surgery consulted and MRCP ordered to evaluate for biliary cholecystitis. But patient refused the MRCP yesterday. Initially scheduled for ERCP today, but cancelled secondary to high BP. MRCP showed large biliary stone winthin CBD with evidence of biliary obstruction with moderate extrahepatic biliary duct dilatation and mild intrahepatic duct dilatation. She will be scheduled in the next two days.      Abdominal pain, other specified site--  Nausea with vomiting-dysphagia-- sludge in gallbladder.  Nausea, vomiting and abdominal pain have resolved. Her liver function panel is normal today.  GI consulted and she underwent HIDA scan which was abnormal.  EGD showed small hiatal hernia.  MRCP showed  showed large biliary stone winthin CBD with evidence of biliary obstruction with moderate extrahepatic biliary duct dilatation and mild intrahepatic duct dilatation.  She was initially scheduled for ERCP, which couldn't be done as her BP shot up to 210/100's. She returned back to the floor and got anti hypertensives and her BP is running in 170/90's.    Cirrhosis of the liver? Ultrasound reveals nodular contour and heterogeneous areas of increased echogenicity -Hepatitis panel ordered by GI and is negative.   CKD 3 versus acute renal failure -Last BUN and  creatinine that we have is from March 2013 when BUN was 20 and creatinine 1.06 -She she is receiving normal saline at 75 cc an hour-follow creatinine with hydration    Diabetes mellitus -Low dose sliding scale insulin-hold metformin. CBG (last 3)   Recent Labs  01/15/14 0742 01/15/14 1027 01/15/14 1320  GLUCAP 101* 102* 123*        Accelerated  HTN (hypertension) -Holding lisinopril/HCTZ because of her renal insufficiency. -IV hydralazine. Added on amlodipine and po scheduled hydralazine.   Mild Thrombocytopenia - recheck in AM, shows improvement.   Elevated lactic acid - this was mild- cont to hydrate  Hypokalemia: - repleted as needed.     Code Status: full code  Family Communication: none at bedside today.  Disposition Plan: to be determined   Consultants:  GI  Surgery  Procedures: MRCP    Antibiotics: Anti-infectives   Start     Dose/Rate Route Frequency Ordered Stop   01/15/14 1015  Ampicillin-Sulbactam (UNASYN) 3 g in sodium chloride 0.9 % 100 mL IVPB  Status:  Discontinued     3 g 100 mL/hr over 60 Minutes Intravenous  Once 01/15/14 1011 01/15/14 1242   01/11/14 2115  ciprofloxacin (CIPRO) IVPB 400 mg  Status:  Discontinued     400 mg 200 mL/hr over 60 Minutes Intravenous Every 12 hours 01/11/14 2102 01/12/14 1533   01/11/14 2115  metroNIDAZOLE (FLAGYL) IVPB 500 mg  Status:  Discontinued     500 mg 100 mL/hr over 60 Minutes Intravenous Every 8 hours 01/11/14 2102 01/12/14 1533       DVT prophylaxis:  heparin   Objective: Filed Weights   01/11/14  2319 01/12/14 2041  Weight: 39.4 kg (86 lb 13.8 oz) 41.096 kg (90 lb 9.6 oz)    Intake/Output Summary (Last 24 hours) at 01/15/14 1601 Last data filed at 01/15/14 1358  Gross per 24 hour  Intake    600 ml  Output    101 ml  Net    499 ml     Vitals Filed Vitals:   01/15/14 0748 01/15/14 1016 01/15/14 1217 01/15/14 1323  BP: 165/94 231/77 221/84 179/84  Pulse: 51 72    Temp: 97.9 F (36.6  C) 99.1 F (37.3 C) 97.4 F (36.3 C)   TempSrc: Oral Oral Oral   Resp: 18 19  18   Height:      Weight:      SpO2: 97% 100%  97%    Exam: General: No acute respiratory distress-awake alert Lungs: Clear to auscultation bilaterally without wheezes or crackles Cardiovascular: Regular rate and rhythm without murmur gallop or rub normal S1 and S2 Abdomen:no tenderness., nondistended, soft, bowel sounds positive, no rebound, no ascites, no appreciable mass Extremities: No significant cyanosis, clubbing, or edema bilateral lower extremities  Data Reviewed: Basic Metabolic Panel:  Recent Labs Lab 01/11/14 1713 01/12/14 0426 01/13/14 0307 01/14/14 0922 01/15/14 0450  NA 138 137 143 143 143  K 4.7 4.3 3.0* 4.1 3.9  CL 100 103 109 109 109  CO2 24 21 23  18* 20  GLUCOSE 160* 120* 92 69* 78  BUN 18 19 18 13 12   CREATININE 1.55* 1.53* 1.61* 1.61* 1.62*  CALCIUM 9.2 9.0 8.2* 8.4 8.5   Liver Function Tests:  Recent Labs Lab 01/11/14 1713 01/12/14 0426 01/13/14 0307 01/14/14 0922 01/15/14 0450  AST 458* 234* 72* 37 31  ALT 199* 139* 68* 46* 34  ALKPHOS 277* 217* 156* 152* 151*  BILITOT 1.6* 1.2 0.5 0.7 0.5  PROT 7.4 6.3 5.3* 5.7* 5.4*  ALBUMIN 3.0* 2.5* 2.1* 2.3* 2.3*    Recent Labs Lab 01/11/14 1713  LIPASE 15   No results found for this basename: AMMONIA,  in the last 168 hours CBC:  Recent Labs Lab 01/11/14 1713 01/12/14 0426 01/14/14 0922  WBC 10.7* 7.1 3.5*  NEUTROABS 9.8*  --   --   HGB 13.0 12.0 10.3*  HCT 40.3 37.5 32.1*  MCV 91.4 93.8 91.7  PLT 178 146* 164   Cardiac Enzymes: No results found for this basename: CKTOTAL, CKMB, CKMBINDEX, TROPONINI,  in the last 168 hours BNP (last 3 results) No results found for this basename: PROBNP,  in the last 8760 hours CBG:  Recent Labs Lab 01/15/14 0404 01/15/14 0459 01/15/14 0742 01/15/14 1027 01/15/14 1320  GLUCAP 67* 126* 101* 102* 123*    No results found for this or any previous visit (from  the past 240 hour(s)).   Studies:  Recent x-ray studies have been reviewed in detail by the Attending Physician  Scheduled Meds:  Scheduled Meds: . amLODipine  10 mg Oral Daily  . dextrose      . feeding supplement (RESOURCE BREEZE)  1 Container Oral Q2000  . hydrALAZINE  25 mg Oral 3 times per day  . insulin aspart  0-9 Units Subcutaneous 6 times per day   Continuous Infusions:    Time spent on care of this patient: >35 min   Shawonda Kerce, MD 01/15/2014, 4:01 PM  LOS: 4 days   [facility] Office  [phone] Pager - Text Page per [email or web address]  If 7PM-7AM, please contact night-coverage [email or web address]

## 2014-01-15 NOTE — Progress Notes (Signed)
Patient ID: Tammy Willis, female   DOB: 16-Jun-1925, 80 y.o.   MRN: [id number]  Subjective: Communicated via interpreter which was rather difficult. She denies any abdominal pain.  She is not sure why she is having ERCP today, discussed.  She refuses to have any surgeries and given her age and co-morbidities, we agree.   Objective:  Vital signs:  Filed Vitals:   01/14/14 1834 01/14/14 2208 01/15/14 0607 01/15/14 0748  BP: 183/70 155/78 210/80 165/94  Pulse: 79 79 72 51  Temp: 98 F (36.7 C) 98.3 F (36.8 C) 97.6 F (36.4 C) 97.9 F (36.6 C)  TempSrc: Oral Oral Oral Oral  Resp: _0 Height:      Weight:      SpO2: 95% 99% 98% 97%    Last BM Date: 01/14/14  Intake/Output   Yesterday:  07/23 0701 - 07/24 0700 In: 600 [P.O.:600] Out: 101 [Urine:100; Stool:1] This shift:    I/O last 3 completed shifts: In: 600 [P.O.:600] Out: 201 [Urine:200; Stool:1]    Physical Exam: General: Pt awake/alert/oriented x4 in no acute distress Abdomen: Soft.  Nondistended.  Nontender.   No evidence of peritonitis.  No incarcerated hernias.   Problem List:   Principal Problem:   Abdominal pain, other specified site Active Problems:   Diabetes mellitus   HTN (hypertension)   Abnormal LFTs   Dysphagia   Acute renal insufficiency   Choledocholithiasis with obstruction   Nausea with vomiting   Protein-calorie malnutrition, severe    Results:   Labs: Results for orders placed during the hospital encounter of 01/11/14 (from the past 48 hour(s))  GLUCOSE, CAPILLARY     Status: None   Collection Time    01/13/14 12:04 PM      Result Value Ref Range   Glucose-Capillary 92  70 - 99 mg/dL  GLUCOSE, CAPILLARY     Status: Abnormal   Collection Time    01/13/14  1:24 PM      Result Value Ref Range   Glucose-Capillary 102 (*) 70 - 99 mg/dL  GLUCOSE, CAPILLARY     Status: Abnormal   Collection Time    01/13/14  4:29 PM      Result Value Ref Range    Glucose-Capillary 102 (*) 70 - 99 mg/dL   Comment 1 Notify RN     Comment 2 Documented in Chart    GLUCOSE, CAPILLARY     Status: None   Collection Time    01/13/14  9:41 PM      Result Value Ref Range   Glucose-Capillary 95  70 - 99 mg/dL  GLUCOSE, CAPILLARY     Status: Abnormal   Collection Time    01/14/14 12:09 AM      Result Value Ref Range   Glucose-Capillary 100 (*) 70 - 99 mg/dL  GLUCOSE, CAPILLARY     Status: None   Collection Time    01/14/14  4:15 AM      Result Value Ref Range   Glucose-Capillary 85  70 - 99 mg/dL  GLUCOSE, CAPILLARY     Status: None   Collection Time    01/14/14  7:48 AM      Result Value Ref Range   Glucose-Capillary 81  70 - 99 mg/dL   Comment 1 Documented in Chart     Comment 2 Notify RN    COMPREHENSIVE METABOLIC PANEL     Status: Abnormal   Collection Time    01/14/14  9:22 AM  Result Value Ref Range   Sodium 143  137 - 147 mEq/L   Potassium 4.1  3.7 - 5.3 mEq/L   Chloride 109  96 - 112 mEq/L   CO2 18 (*) 19 - 32 mEq/L   Glucose, Bld 69 (*) 70 - 99 mg/dL   BUN 13  6 - 23 mg/dL   Creatinine, Ser 1.61 (*) 0.50 - 1.10 mg/dL   Calcium 8.4  8.4 - 10.5 mg/dL   Total Protein 5.7 (*) 6.0 - 8.3 g/dL   Albumin 2.3 (*) 3.5 - 5.2 g/dL   AST 37  0 - 37 U/L   ALT 46 (*) 0 - 35 U/L   Alkaline Phosphatase 152 (*) 39 - 117 U/L   Total Bilirubin 0.7  0.3 - 1.2 mg/dL   GFR calc non Af Amer 28 (*) >90 mL/min   GFR calc Af Amer 32 (*) >90 mL/min   Comment: (NOTE)     The eGFR has been calculated using the CKD EPI equation.     This calculation has not been validated in all clinical situations.     eGFR's persistently <90 mL/min signify possible Chronic Kidney     Disease.   Anion gap 16 (*) 5 - 15  CBC     Status: Abnormal   Collection Time    01/14/14  9:22 AM      Result Value Ref Range   WBC 3.5 (*) 4.0 - 10.5 K/uL   RBC 3.50 (*) 3.87 - 5.11 MIL/uL   Hemoglobin 10.3 (*) 12.0 - 15.0 g/dL   HCT 32.1 (*) 36.0 - 46.0 %   MCV 91.7  78.0 -  100.0 fL   MCH 29.4  26.0 - 34.0 pg   MCHC 32.1  30.0 - 36.0 g/dL   RDW 16.5 (*) 11.5 - 15.5 %   Platelets 164  150 - 400 K/uL  PROTIME-INR     Status: None   Collection Time    01/14/14  9:22 AM      Result Value Ref Range   Prothrombin Time 13.1  11.6 - 15.2 seconds   INR 0.99  0.00 - 1.49  GLUCOSE, CAPILLARY     Status: Abnormal   Collection Time    01/14/14 12:19 PM      Result Value Ref Range   Glucose-Capillary 115 (*) 70 - 99 mg/dL   Comment 1 Documented in Chart     Comment 2 Notify RN    GLUCOSE, CAPILLARY     Status: Abnormal   Collection Time    01/14/14  4:29 PM      Result Value Ref Range   Glucose-Capillary 119 (*) 70 - 99 mg/dL   Comment 1 Notify RN    GLUCOSE, CAPILLARY     Status: Abnormal   Collection Time    01/14/14  8:05 PM      Result Value Ref Range   Glucose-Capillary 208 (*) 70 - 99 mg/dL  GLUCOSE, CAPILLARY     Status: Abnormal   Collection Time    01/15/14 12:21 AM      Result Value Ref Range   Glucose-Capillary 149 (*) 70 - 99 mg/dL  GLUCOSE, CAPILLARY     Status: Abnormal   Collection Time    01/15/14  4:04 AM      Result Value Ref Range   Glucose-Capillary 67 (*) 70 - 99 mg/dL  COMPREHENSIVE METABOLIC PANEL     Status: Abnormal   Collection Time  01/15/14  4:50 AM      Result Value Ref Range   Sodium 143  137 - 147 mEq/L   Potassium 3.9  3.7 - 5.3 mEq/L   Chloride 109  96 - 112 mEq/L   CO2 20  19 - 32 mEq/L   Glucose, Bld 78  70 - 99 mg/dL   BUN 12  6 - 23 mg/dL   Creatinine, Ser 1.62 (*) 0.50 - 1.10 mg/dL   Calcium 8.5  8.4 - 10.5 mg/dL   Total Protein 5.4 (*) 6.0 - 8.3 g/dL   Albumin 2.3 (*) 3.5 - 5.2 g/dL   AST 31  0 - 37 U/L   ALT 34  0 - 35 U/L   Alkaline Phosphatase 151 (*) 39 - 117 U/L   Total Bilirubin 0.5  0.3 - 1.2 mg/dL   GFR calc non Af Amer 27 (*) >90 mL/min   GFR calc Af Amer 32 (*) >90 mL/min   Comment: (NOTE)     The eGFR has been calculated using the CKD EPI equation.     This calculation has not been  validated in all clinical situations.     eGFR's persistently <90 mL/min signify possible Chronic Kidney     Disease.   Anion gap 14  5 - 15  GLUCOSE, CAPILLARY     Status: Abnormal   Collection Time    01/15/14  4:59 AM      Result Value Ref Range   Glucose-Capillary 126 (*) 70 - 99 mg/dL    Imaging / Studies: Mr Abdomen Mrcp Wo Cm  01/14/2014   CLINICAL DATA:  Ultrasound 01/11/2014, HIDA scan 01/13/2014  EXAM: MRI ABDOMEN WITHOUT  (INCLUDING MRCP)  TECHNIQUE: Multiplanar multisequence MR imaging of the abdomen was performed. Heavily T2-weighted images of the biliary and pancreatic ducts were obtained, and three-dimensional MRCP images were rendered by post processing.  COMPARISON:  Ultrasound 01/11/2014, HIDA scan 01/13/2014, CT 01/09/2011  FINDINGS: There is mild intrahepatic biliary duct dilatation. There is significant dilatation of common hepatic duct and common bile duct. The common bile duct measures 14 mm. There is a large calculus within the distal common bile duct measuring 14 mm seen on coronal series 8, image 15. There is no significant pancreatic duct dilatation. The gallbladder is distended to 5.4 cm. There is echogenic sludge versus small stones layering within the gallbladder. No significant pericholecystic fluid.  There is no focal hepatic. The pancreatic parenchyma is normal. The spleen adrenal glands normal. There is a cysts within the cortex of left and right kidneys which likely benign. No periportal retroperitoneal adenopathy. No pleural fluid.  IMPRESSION: 1. Large calculus within the distal common bile duct with evidence of biliary obstruction with moderate extrahepatic biliary duct dilatation and mild intrahepatic biliary duct dilatation. 2. The gallbladder is distended with sludge which is also likely related to the obstruction. Evidence of chronic cholecystitis on HIDA scan 3. Biliary obstruction is partial as demonstrated on HIDA of 01/13/2014.   Electronically Signed   By:  Suzy Bouchard M.D.   On: 01/14/2014 08:29   Mr 3d Recon At Scanner  01/14/2014   CLINICAL DATA:  Ultrasound 01/11/2014, HIDA scan 01/13/2014  EXAM: MRI ABDOMEN WITHOUT  (INCLUDING MRCP)  TECHNIQUE: Multiplanar multisequence MR imaging of the abdomen was performed. Heavily T2-weighted images of the biliary and pancreatic ducts were obtained, and three-dimensional MRCP images were rendered by post processing.  COMPARISON:  Ultrasound 01/11/2014, HIDA scan 01/13/2014, CT 01/09/2011  FINDINGS: There is  mild intrahepatic biliary duct dilatation. There is significant dilatation of common hepatic duct and common bile duct. The common bile duct measures 14 mm. There is a large calculus within the distal common bile duct measuring 14 mm seen on coronal series 8, image 15. There is no significant pancreatic duct dilatation. The gallbladder is distended to 5.4 cm. There is echogenic sludge versus small stones layering within the gallbladder. No significant pericholecystic fluid.  There is no focal hepatic. The pancreatic parenchyma is normal. The spleen adrenal glands normal. There is a cysts within the cortex of left and right kidneys which likely benign. No periportal retroperitoneal adenopathy. No pleural fluid.  IMPRESSION: 1. Large calculus within the distal common bile duct with evidence of biliary obstruction with moderate extrahepatic biliary duct dilatation and mild intrahepatic biliary duct dilatation. 2. The gallbladder is distended with sludge which is also likely related to the obstruction. Evidence of chronic cholecystitis on HIDA scan 3. Biliary obstruction is partial as demonstrated on HIDA of 01/13/2014.   Electronically Signed   By: Suzy Bouchard M.D.   On: 01/14/2014 08:29   Nm Hepato W/eject Fract  01/13/2014   CLINICAL DATA:  Abdominal pain  EXAM: NUCLEAR MEDICINE HEPATOBILIARY IMAGING WITH GALLBLADDER EF  TECHNIQUE: Sequential images of the abdomen were obtained out to 60 minutes following  intravenous administration of radiopharmaceutical. After slow intravenous infusion of 0.8 micrograms Cholecystokinin, gallbladder ejection fraction was determined.  RADIOPHARMACEUTICALS:  5.0 [employer] [id number] Choletec  COMPARISON:  Ultrasound 01/11/2014.  FINDINGS: There is normal uptake of the tracer by the liver. CBD visualized at 30 min. Gallbladder visualized at 75 min. Post CCK gallbladder ejection fraction -7.7%. At 30 min, normal ejection fraction is greater than 30%.  The patient did not experience symptoms during CCK infusion.  IMPRESSION: No cystic duct obstruction. Gallbladder ejection fraction is only -7.7%. Normal gallbladder ejection fraction should be greater than 30%.   Electronically Signed   By: Lahoma Crocker M.D.   On: 01/13/2014 12:10   Scheduled Meds: . dextrose      . feeding supplement (RESOURCE BREEZE)  1 Container Oral Q2000  . insulin aspart  0-9 Units Subcutaneous 6 times per day   Continuous Infusions:  PRN Meds:.dextrose, dextrose, gi cocktail, hydrALAZINE, ibuprofen, labetalol   Antibiotics: Anti-infectives   Start     Dose/Rate Route Frequency Ordered Stop   01/11/14 2115  ciprofloxacin (CIPRO) IVPB 400 mg  Status:  Discontinued     400 mg 200 mL/hr over 60 Minutes Intravenous Every 12 hours 01/11/14 2102 01/12/14 1533   01/11/14 2115  metroNIDAZOLE (FLAGYL) IVPB 500 mg  Status:  Discontinued     500 mg 100 mL/hr over 60 Minutes Intravenous Every 8 hours 01/11/14 2102 01/12/14 1533      Assessment/Plan  Periumbilical abdominal pain, nausea and vomiting/hematemesis  Gallbladder sludge, and CBD dilatation,  +MRCP Normal Hida scan, but low EF 7%  Elevated LFT's, Improving - (Hepatititis panel is negative)  EGD 01/13/14 Dr. Lucio Edward: Small hiatal hernia  Stage III renal disease  Hypertension  not a surgical candidate.  Furthermore, the patient refuses surgery.  ERCP per GI.  Will follow.     Erby Pian, [facility] Surgery Pager  [phone] Office [phone]  01/15/2014 9:15 AM

## 2014-01-16 LAB — GLUCOSE, CAPILLARY
Glucose-Capillary: 87 mg/dL (ref 70–99)
Glucose-Capillary: 185 mg/dL — ABNORMAL HIGH (ref 70–99)
Glucose-Capillary: 164 mg/dL — ABNORMAL HIGH (ref 70–99)
Glucose-Capillary: 173 mg/dL — ABNORMAL HIGH (ref 70–99)
Glucose-Capillary: 179 mg/dL — ABNORMAL HIGH (ref 70–99)
Glucose-Capillary: 163 mg/dL — ABNORMAL HIGH (ref 70–99)

## 2014-01-16 MED ORDER — HYDRALAZINE HCL 50 MG PO TABS
50.0000 mg | ORAL_TABLET | Freq: Three times a day (TID) | ORAL | Status: DC
Start: 2014-01-16 — End: 2014-01-19
  Administered 2014-01-16 – 2014-01-18 (×8): 50 mg via ORAL
  Filled 2014-01-16 (×12): qty 1

## 2014-01-16 NOTE — Progress Notes (Signed)
[facility] Progress Note   Takela Varden RXV:[id number] DOB: 03-Sep-1924 DOA: 01/11/2014 PCP: Pcp Not In System  Brief narrative: Tammy Willis is a 80 y.o. female  with type 2 diabetes mellitus, hypertension who presents with abdominal pain. She's been having pain after eating for a couple of months now. She decided to come to the hospital due to significant weakness. In the ER she was found to have an ultrasound which was negative for an acute cholecystitis. LFTs are elevated-  alkaline phosphatase is 217. She waas found to have sludge in the [place]. HIDA scan abnormal, she underwent EGD which showed a small hiatal hernia. Surgery consulted and MRCP ordered to evaluate for biliary cholecystitis. But patient refused the MRCP yesterday. Initially scheduled for ERCP today, but cancelled secondary to high BP. MRCP showed large biliary stone winthin CBD with evidence of biliary obstruction with moderate extrahepatic biliary duct dilatation and mild intrahepatic duct dilatation.  Patient agrees to have the procedure done.      Abdominal pain, other specified site--  Nausea with vomiting-dysphagia-- sludge in gallbladder.  Nausea, vomiting and abdominal pain have resolved. Her liver function panel is normal today.  GI consulted and she underwent HIDA scan which was abnormal.  EGD showed small hiatal hernia.  MRCP showed  showed large biliary stone winthin CBD with evidence of biliary obstruction with moderate extrahepatic biliary duct dilatation and mild intrahepatic duct dilatation.  She was initially scheduled for ERCP, which couldn't be done as her BP shot up to 210/100's. Her BP is better controlled today and she has agreed to undergo the procedure when planned.   Cirrhosis of the liver? Ultrasound reveals nodular contour and heterogeneous areas of increased echogenicity -Hepatitis panel ordered by GI and is negative.   CKD 3 versus acute renal failure -Last BUN and creatinine  that we have is from March 2013 when BUN was 20 and creatinine 1.06 - her creatinine has stabilized at 1.62    Diabetes mellitus -Low dose sliding scale insulin-hold metformin. CBG (last 3)   Recent Labs  01/16/14 0427 01/16/14 0801 01/16/14 1218  GLUCAP 185* 164* 173*        Accelerated  HTN (hypertension) -Holding lisinopril/HCTZ because of her renal insufficiency. -IV hydralazine. Added on amlodipine and po scheduled hydralazine.   Mild Thrombocytopenia - recheck in AM, shows improvement.   Elevated lactic acid - this was mild- cont to hydrate  Hypokalemia: - repleted as needed.     Code Status: full code  Family Communication:son in law at bedside.  Disposition Plan: to be determined   Consultants:  GI  Surgery  Procedures: MRCP    Antibiotics: Anti-infectives   Start     Dose/Rate Route Frequency Ordered Stop   01/15/14 1015  Ampicillin-Sulbactam (UNASYN) 3 g in sodium chloride 0.9 % 100 mL IVPB  Status:  Discontinued     3 g 100 mL/hr over 60 Minutes Intravenous  Once 01/15/14 1011 01/15/14 1242   01/11/14 2115  ciprofloxacin (CIPRO) IVPB 400 mg  Status:  Discontinued     400 mg 200 mL/hr over 60 Minutes Intravenous Every 12 hours 01/11/14 2102 01/12/14 1533   01/11/14 2115  metroNIDAZOLE (FLAGYL) IVPB 500 mg  Status:  Discontinued     500 mg 100 mL/hr over 60 Minutes Intravenous Every 8 hours 01/11/14 2102 01/12/14 1533       DVT prophylaxis:  heparin   Objective: Filed Weights   01/11/14 2319 01/12/14 2041 01/15/14 2041  Weight: 39.4 kg (86  lb 13.8 oz) 41.096 kg (90 lb 9.6 oz) 42.048 kg (92 lb 11.2 oz)    Intake/Output Summary (Last 24 hours) at 01/16/14 1612 Last data filed at 01/16/14 1334  Gross per 24 hour  Intake   1200 ml  Output      0 ml  Net   1200 ml     Vitals Filed Vitals:   01/15/14 1709 01/15/14 2041 01/16/14 0428 01/16/14 0857  BP: 121/70 107/61 180/78 143/77  Pulse: 94 74 79 84  Temp: 98.1 F (36.7 C) 98.2 F  (36.8 C) 97.6 F (36.4 C) 98.3 F (36.8 C)  TempSrc: Oral Oral Oral Oral  Resp: 16 16 16 18   Height:  5' (1.524 m)    Weight:  42.048 kg (92 lb 11.2 oz)    SpO2: 94% 95% 96% 96%    Exam: General: No acute respiratory distress-awake alert Lungs: Clear to auscultation bilaterally without wheezes or crackles Cardiovascular: Regular rate and rhythm without murmur gallop or rub normal S1 and S2 Abdomen:no tenderness., nondistended, soft, bowel sounds positive, no rebound, no ascites, no appreciable mass Extremities: No significant cyanosis, clubbing, or edema bilateral lower extremities  Data Reviewed: Basic Metabolic Panel:  Recent Labs Lab 01/11/14 1713 01/12/14 0426 01/13/14 0307 01/14/14 0922 01/15/14 0450  NA 138 137 143 143 143  K 4.7 4.3 3.0* 4.1 3.9  CL 100 103 109 109 109  CO2 24 21 23  18* 20  GLUCOSE 160* 120* 92 69* 78  BUN 18 19 18 13 12   CREATININE 1.55* 1.53* 1.61* 1.61* 1.62*  CALCIUM 9.2 9.0 8.2* 8.4 8.5   Liver Function Tests:  Recent Labs Lab 01/11/14 1713 01/12/14 0426 01/13/14 0307 01/14/14 0922 01/15/14 0450  AST 458* 234* 72* 37 31  ALT 199* 139* 68* 46* 34  ALKPHOS 277* 217* 156* 152* 151*  BILITOT 1.6* 1.2 0.5 0.7 0.5  PROT 7.4 6.3 5.3* 5.7* 5.4*  ALBUMIN 3.0* 2.5* 2.1* 2.3* 2.3*    Recent Labs Lab 01/11/14 1713  LIPASE 15   No results found for this basename: AMMONIA,  in the last 168 hours CBC:  Recent Labs Lab 01/11/14 1713 01/12/14 0426 01/14/14 0922  WBC 10.7* 7.1 3.5*  NEUTROABS 9.8*  --   --   HGB 13.0 12.0 10.3*  HCT 40.3 37.5 32.1*  MCV 91.4 93.8 91.7  PLT 178 146* 164   Cardiac Enzymes: No results found for this basename: CKTOTAL, CKMB, CKMBINDEX, TROPONINI,  in the last 168 hours BNP (last 3 results) No results found for this basename: PROBNP,  in the last 8760 hours CBG:  Recent Labs Lab 01/15/14 2345 01/16/14 0001 01/16/14 0427 01/16/14 0801 01/16/14 1218  GLUCAP 61* 87 185* 164* 173*    No  results found for this or any previous visit (from the past 240 hour(s)).   Studies:  Recent x-ray studies have been reviewed in detail by the Attending Physician  Scheduled Meds:  Scheduled Meds: . amLODipine  10 mg Oral Daily  . feeding supplement (RESOURCE BREEZE)  1 Container Oral Q2000  . hydrALAZINE  50 mg Oral 3 times per day  . insulin aspart  0-9 Units Subcutaneous 6 times per day   Continuous Infusions:    Time spent on care of this patient: 25 min   [place], MD 01/16/2014, 4:12 PM  LOS: 5 days   [facility] Office  [phone] Pager - Text Page per [email or web address]  If 7PM-7AM, please contact night-coverage [email or web address]

## 2014-01-16 NOTE — Progress Notes (Signed)
Subjective: No acute events.  Objective: Vital signs in last 24 hours: Temp:  [97.4 F (36.3 C)-99.1 F (37.3 C)] 97.6 F (36.4 C) (07/25 0428) Pulse Rate:  [51-94] 79 (07/25 0428) Resp:  [16-19] 16 (07/25 0428) BP: (107-231)/(61-94) 180/78 mmHg (07/25 0428) SpO2:  [94 %-100 %] 96 % (07/25 0428) Weight:  [92 lb 11.2 oz (42.048 kg)] 92 lb 11.2 oz (42.048 kg) (07/24 2041) Last BM Date: 01/14/14  Intake/Output from previous day: 07/24 0701 - 07/25 0700 In: 960 [P.O.:960] Out: -  Intake/Output this shift: Total I/O In: 240 [P.O.:240] Out: -   General appearance: alert and no distress GI: soft, non-tender; bowel sounds normal; no masses,  no organomegaly  Lab Results:  Recent Labs  01/14/14 0922  WBC 3.5*  HGB 10.3*  HCT 32.1*  PLT 164   BMET  Recent Labs  01/14/14 0922 01/15/14 0450  NA 143 143  K 4.1 3.9  CL 109 109  CO2 18* 20  GLUCOSE 69* 78  BUN 13 12  CREATININE 1.61* 1.62*  CALCIUM 8.4 8.5   LFT  Recent Labs  01/15/14 0450  PROT 5.4*  ALBUMIN 2.3*  AST 31  ALT 34  ALKPHOS 151*  BILITOT 0.5   PT/INR  Recent Labs  01/14/14 0922  LABPROT 13.1  INR 0.99   Hepatitis Panel No results found for this basename: HEPBSAG, HCVAB, HEPAIGM, HEPBIGM,  in the last 72 hours C-Diff No results found for this basename: CDIFFTOX,  in the last 72 hours Fecal Lactopherrin No results found for this basename: FECLLACTOFRN,  in the last 72 hours  Studies/Results: No results found.  Medications:  Scheduled: . amLODipine  10 mg Oral Daily  . feeding supplement (RESOURCE BREEZE)  1 Container Oral Q2000  . hydrALAZINE  25 mg Oral 3 times per day  . insulin aspart  0-9 Units Subcutaneous 6 times per day   Continuous:   Assessment/Plan: 1) Choledocholithiasis.   My examination was benign.  She and her family does not speak [place] and there was nobody to translate.  An ERCP was attempted by Dr. Carlean Purl and it was cancelled as a result of her HTN.  She  has a large CBD stone, but her liver enzymes have improved.  Per my conversation with Dr. Carlean Purl, she does not want to pursue an ERCP.  Plan: 1) HTN control. 2) Hold on ERCP.  LOS: 5 days   Kandie Keiper D 01/16/2014, 6:48 AM

## 2014-01-17 LAB — GLUCOSE, CAPILLARY
GLUCOSE-CAPILLARY: 112 mg/dL — AB (ref 70–99)
GLUCOSE-CAPILLARY: 118 mg/dL — AB (ref 70–99)
GLUCOSE-CAPILLARY: 96 mg/dL (ref 70–99)
Glucose-Capillary: 112 mg/dL — ABNORMAL HIGH (ref 70–99)
Glucose-Capillary: 195 mg/dL — ABNORMAL HIGH (ref 70–99)
Glucose-Capillary: 128 mg/dL — ABNORMAL HIGH (ref 70–99)

## 2014-01-17 NOTE — Progress Notes (Signed)
Subjective: No acute events.  Objective: Vital signs in last 24 hours: Temp:  [97.8 F (36.6 C)-98.5 F (36.9 C)] 98.5 F (36.9 C) (07/26 0430) Pulse Rate:  [81-84] 84 (07/26 0430) Resp:  [16-18] 18 (07/26 0430) BP: (129-146)/(58-77) 129/62 mmHg (07/26 0430) SpO2:  [95 %-98 %] 98 % (07/26 0430) Weight:  [96 lb 7.2 oz (43.75 kg)] 96 lb 7.2 oz (43.75 kg) (07/25 2114) Last BM Date: 01/16/14  Intake/Output from previous day: 07/25 0701 - 07/26 0700 In: 960 [P.O.:960] Out: -  Intake/Output this shift:    General appearance: alert and no distress GI: soft, non-tender; bowel sounds normal; no masses,  no organomegaly  Lab Results:  Recent Labs  01/14/14 0922  WBC 3.5*  HGB 10.3*  HCT 32.1*  PLT 164   BMET  Recent Labs  01/14/14 0922 01/15/14 0450  NA 143 143  K 4.1 3.9  CL 109 109  CO2 18* 20  GLUCOSE 69* 78  BUN 13 12  CREATININE 1.61* 1.62*  CALCIUM 8.4 8.5   LFT  Recent Labs  01/15/14 0450  PROT 5.4*  ALBUMIN 2.3*  AST 31  ALT 34  ALKPHOS 151*  BILITOT 0.5   PT/INR  Recent Labs  01/14/14 0922  LABPROT 13.1  INR 0.99   Hepatitis Panel No results found for this basename: HEPBSAG, HCVAB, HEPAIGM, HEPBIGM,  in the last 72 hours C-Diff No results found for this basename: CDIFFTOX,  in the last 72 hours Fecal Lactopherrin No results found for this basename: FECLLACTOFRN,  in the last 72 hours  Studies/Results: No results found.  Medications:  Scheduled: . amLODipine  10 mg Oral Daily  . feeding supplement (RESOURCE BREEZE)  1 Container Oral Q2000  . hydrALAZINE  50 mg Oral 3 times per day  . insulin aspart  0-9 Units Subcutaneous 6 times per day   Continuous:   Assessment/Plan: 1) Choledocholithiasis. 2) CRI.   I received a call from Dr. Karleen Hampshire stating that the patient has consented to the ERCP.  I also confirmed this decision with her daughter through her son, who served as the interpreter.  I will schedule her for the procedure with  anesthesia.  Plan: 1) ERCP with Dr. Fuller Plan under MAC tomorrow.     LOS: 6 days   Arissa Fagin D 01/17/2014, 7:34 AM

## 2014-01-17 NOTE — Progress Notes (Signed)
[facility] Progress Note   Tammy Willis JYN:[id number] DOB: 03-Aug-1924 DOA: 01/11/2014 PCP: Pcp Not In System  Brief narrative: Tammy Willis is a 80 y.o. female  with type 2 diabetes mellitus, hypertension who presents with abdominal pain. She's been having pain after eating for a couple of months now. She decided to come to the hospital due to significant weakness. In the ER she was found to have an ultrasound which was negative for an acute cholecystitis. LFTs are elevated-  alkaline phosphatase is 217. She waas found to have sludge in the [place]. HIDA scan abnormal, she underwent EGD which showed a small hiatal hernia. Surgery consulted and MRCP ordered to evaluate for biliary cholecystitis. But patient refused the MRCP yesterday. Initially scheduled for ERCP today, but cancelled secondary to high BP. MRCP showed large biliary stone winthin CBD with evidence of biliary obstruction with moderate extrahepatic biliary duct dilatation and mild intrahepatic duct dilatation.  Patient agrees to have the procedure done. Plan for tomorrow.      Abdominal pain, other specified site--  Nausea with vomiting-dysphagia-- sludge in gallbladder.  Nausea, vomiting and abdominal pain have resolved. Her liver function panel is normal today.  GI consulted and she underwent HIDA scan which was abnormal.  EGD showed small hiatal hernia.  MRCP showed  showed large biliary stone winthin CBD with evidence of biliary obstruction with moderate extrahepatic biliary duct dilatation and mild intrahepatic duct dilatation.  She was initially scheduled for ERCP, which couldn't be done as her BP shot up to 210/100's. Her BP is better controlled today and she has agreed to undergo the procedure when planned.   Cirrhosis of the liver? Ultrasound reveals nodular contour and heterogeneous areas of increased echogenicity -Hepatitis panel ordered by GI and is negative.   CKD 3 versus acute renal failure -Last  BUN and creatinine that we have is from March 2013 when BUN was 20 and creatinine 1.06 - her creatinine has stabilized at 1.62    Diabetes mellitus -Low dose sliding scale insulin-hold metformin. CBG (last 3)   Recent Labs  01/17/14 0743 01/17/14 1139 01/17/14 1623  GLUCAP 118* 112* 195*        Accelerated  HTN (hypertension) -Holding lisinopril/HCTZ because of her renal insufficiency. -IV hydralazine. Added on amlodipine and po scheduled hydralazine.   Mild Thrombocytopenia - recheck in AM, shows improvement.   Elevated lactic acid - this was mild- cont to hydrate  Hypokalemia: - repleted as needed.     Code Status: full code  Family Communication:son in law at bedside.  Disposition Plan: to be determined   Consultants:  GI  Surgery  Procedures: MRCP    Antibiotics: Anti-infectives   Start     Dose/Rate Route Frequency Ordered Stop   01/15/14 1015  Ampicillin-Sulbactam (UNASYN) 3 g in sodium chloride 0.9 % 100 mL IVPB  Status:  Discontinued     3 g 100 mL/hr over 60 Minutes Intravenous  Once 01/15/14 1011 01/15/14 1242   01/11/14 2115  ciprofloxacin (CIPRO) IVPB 400 mg  Status:  Discontinued     400 mg 200 mL/hr over 60 Minutes Intravenous Every 12 hours 01/11/14 2102 01/12/14 1533   01/11/14 2115  metroNIDAZOLE (FLAGYL) IVPB 500 mg  Status:  Discontinued     500 mg 100 mL/hr over 60 Minutes Intravenous Every 8 hours 01/11/14 2102 01/12/14 1533       DVT prophylaxis:  heparin   Objective: Filed Weights   01/12/14 2041 01/15/14 2041 01/16/14 2114  Weight:  41.096 kg (90 lb 9.6 oz) 42.048 kg (92 lb 11.2 oz) 43.75 kg (96 lb 7.2 oz)    Intake/Output Summary (Last 24 hours) at 01/17/14 1708 Last data filed at 01/17/14 0700  Gross per 24 hour  Intake    480 ml  Output      0 ml  Net    480 ml     Vitals Filed Vitals:   01/16/14 1659 01/16/14 2016 01/16/14 2114 01/17/14 0430  BP: 146/72 132/58  129/62  Pulse: 83 81  84  Temp: 98.3 F (36.8 C)   97.8 F (36.6 C) 98.5 F (36.9 C)  TempSrc: Oral  Oral Oral  Resp: 16 16  18   Height:   5' (1.524 m)   Weight:   43.75 kg (96 lb 7.2 oz)   SpO2: 95% 96%  98%    Exam: General: No acute respiratory distress-awake alert Lungs: Clear to auscultation bilaterally without wheezes or crackles Cardiovascular: Regular rate and rhythm without murmur gallop or rub normal S1 and S2 Abdomen:mild tenderness RUQ, nondistended, soft, bowel sounds positive, no rebound, no ascites, no appreciable mass Extremities: No significant cyanosis, clubbing, or edema bilateral lower extremities  Data Reviewed: Basic Metabolic Panel:  Recent Labs Lab 01/11/14 1713 01/12/14 0426 01/13/14 0307 01/14/14 0922 01/15/14 0450  NA 138 137 143 143 143  K 4.7 4.3 3.0* 4.1 3.9  CL 100 103 109 109 109  CO2 24 21 23  18* 20  GLUCOSE 160* 120* 92 69* 78  BUN 18 19 18 13 12   CREATININE 1.55* 1.53* 1.61* 1.61* 1.62*  CALCIUM 9.2 9.0 8.2* 8.4 8.5   Liver Function Tests:  Recent Labs Lab 01/11/14 1713 01/12/14 0426 01/13/14 0307 01/14/14 0922 01/15/14 0450  AST 458* 234* 72* 37 31  ALT 199* 139* 68* 46* 34  ALKPHOS 277* 217* 156* 152* 151*  BILITOT 1.6* 1.2 0.5 0.7 0.5  PROT 7.4 6.3 5.3* 5.7* 5.4*  ALBUMIN 3.0* 2.5* 2.1* 2.3* 2.3*    Recent Labs Lab 01/11/14 1713  LIPASE 15   No results found for this basename: AMMONIA,  in the last 168 hours CBC:  Recent Labs Lab 01/11/14 1713 01/12/14 0426 01/14/14 0922  WBC 10.7* 7.1 3.5*  NEUTROABS 9.8*  --   --   HGB 13.0 12.0 10.3*  HCT 40.3 37.5 32.1*  MCV 91.4 93.8 91.7  PLT 178 146* 164   Cardiac Enzymes: No results found for this basename: CKTOTAL, CKMB, CKMBINDEX, TROPONINI,  in the last 168 hours BNP (last 3 results) No results found for this basename: PROBNP,  in the last 8760 hours CBG:  Recent Labs Lab 01/17/14 0011 01/17/14 0414 01/17/14 0743 01/17/14 1139 01/17/14 1623  GLUCAP 96 112* 118* 112* 195*    No results found for  this or any previous visit (from the past 240 hour(s)).   Studies:  Recent x-ray studies have been reviewed in detail by the Attending Physician  Scheduled Meds:  Scheduled Meds: . amLODipine  10 mg Oral Daily  . feeding supplement (RESOURCE BREEZE)  1 Container Oral Q2000  . hydrALAZINE  50 mg Oral 3 times per day  . insulin aspart  0-9 Units Subcutaneous 6 times per day   Continuous Infusions:    Time spent on care of this patient: 25 min   Taliesin Hartlage, MD 01/17/2014, 5:08 PM  LOS: 6 days   [facility] Office  [phone] Pager - Text Page per [email or web address]  If 7PM-7AM, please contact night-coverage [email or web address]

## 2014-01-18 ENCOUNTER — Encounter (HOSPITAL_COMMUNITY): Payer: Medicaid Other | Admitting: Certified Registered Nurse Anesthetist

## 2014-01-18 ENCOUNTER — Inpatient Hospital Stay (HOSPITAL_COMMUNITY): Payer: Medicaid Other

## 2014-01-18 ENCOUNTER — Encounter (HOSPITAL_COMMUNITY): Payer: Self-pay | Admitting: Gastroenterology

## 2014-01-18 ENCOUNTER — Inpatient Hospital Stay (HOSPITAL_COMMUNITY): Payer: Medicaid Other | Admitting: Certified Registered Nurse Anesthetist

## 2014-01-18 ENCOUNTER — Encounter (HOSPITAL_COMMUNITY)
Admission: EM | Disposition: A | Discharge status: Home Health | Payer: Self-pay | Source: Home / Self Care | Attending: Internal Medicine

## 2014-01-18 DIAGNOSIS — R1011 Right upper quadrant pain: Secondary | ICD-10-CM

## 2014-01-18 HISTORY — PX: ERCP: SHX5425

## 2014-01-18 LAB — GLUCOSE, CAPILLARY
GLUCOSE-CAPILLARY: 86 mg/dL (ref 70–99)
Glucose-Capillary: 104 mg/dL — ABNORMAL HIGH (ref 70–99)
Glucose-Capillary: 109 mg/dL — ABNORMAL HIGH (ref 70–99)
Glucose-Capillary: 110 mg/dL — ABNORMAL HIGH (ref 70–99)
Glucose-Capillary: 206 mg/dL — ABNORMAL HIGH (ref 70–99)
Glucose-Capillary: 98 mg/dL (ref 70–99)

## 2014-01-18 LAB — BASIC METABOLIC PANEL
Anion gap: 12 (ref 5–15)
BUN: 14 mg/dL (ref 6–23)
CALCIUM: 8.8 mg/dL (ref 8.4–10.5)
CO2: 21 mEq/L (ref 19–32)
CREATININE: 1.79 mg/dL — AB (ref 0.50–1.10)
Chloride: 106 mEq/L (ref 96–112)
GFR, EST AFRICAN AMERICAN: 28 mL/min — AB (ref >90)
GFR, EST NON AFRICAN AMERICAN: 24 mL/min — AB (ref >90)
Glucose, Bld: 110 mg/dL — ABNORMAL HIGH (ref 70–99)
POTASSIUM: 3.9 meq/L (ref 3.7–5.3)
Sodium: 139 mEq/L (ref 137–147)

## 2014-01-18 SURGERY — ERCP, WITH INTERVENTION IF INDICATED
Anesthesia: General

## 2014-01-18 MED ORDER — CIPROFLOXACIN IN D5W 400 MG/200ML IV SOLN
INTRAVENOUS | Status: DC | PRN
  Administered 2014-01-18: 400 mg via INTRAVENOUS

## 2014-01-18 MED ORDER — GLUCAGON HCL RDNA (DIAGNOSTIC) 1 MG IJ SOLR
INTRAMUSCULAR | Status: DC | PRN
  Administered 2014-01-18 (×3): 0.25 mg via INTRAVENOUS

## 2014-01-18 MED ORDER — ONDANSETRON HCL 4 MG/2ML IJ SOLN: 4.0000 mg | Freq: Once | INTRAMUSCULAR | Status: AC | PRN

## 2014-01-18 MED ORDER — SODIUM CHLORIDE 0.9 % IV SOLN: INTRAVENOUS | Status: DC

## 2014-01-18 MED ORDER — ONDANSETRON HCL 4 MG/2ML IJ SOLN
INTRAMUSCULAR | Status: DC | PRN
  Administered 2014-01-18: 4 mg via INTRAVENOUS

## 2014-01-18 MED ORDER — CIPROFLOXACIN IN D5W 400 MG/200ML IV SOLN
INTRAVENOUS | Status: AC
  Filled 2014-01-18: qty 200

## 2014-01-18 MED ORDER — ARTIFICIAL TEARS OP OINT
TOPICAL_OINTMENT | OPHTHALMIC | Status: DC | PRN
  Administered 2014-01-18: 1 via OPHTHALMIC

## 2014-01-18 MED ORDER — FENTANYL CITRATE 0.05 MG/ML IJ SOLN: 25.0000 ug | INTRAMUSCULAR | Status: DC | PRN

## 2014-01-18 MED ORDER — PROPOFOL 10 MG/ML IV BOLUS
INTRAVENOUS | Status: DC | PRN
  Administered 2014-01-18: 130 mg via INTRAVENOUS

## 2014-01-18 MED ORDER — LACTATED RINGERS IV SOLN
INTRAVENOUS | Status: DC | PRN
  Administered 2014-01-18: 10:00:00 via INTRAVENOUS

## 2014-01-18 MED ORDER — LIDOCAINE HCL (CARDIAC) 20 MG/ML IV SOLN
INTRAVENOUS | Status: DC | PRN
  Administered 2014-01-18: 50 mg via INTRAVENOUS

## 2014-01-18 MED ORDER — PHENYLEPHRINE HCL 10 MG/ML IJ SOLN
INTRAMUSCULAR | Status: DC | PRN
  Administered 2014-01-18: 80 ug via INTRAVENOUS

## 2014-01-18 MED ORDER — SODIUM CHLORIDE 0.9 % IV SOLN
INTRAVENOUS | Status: DC | PRN
  Administered 2014-01-18: 11:00:00

## 2014-01-18 MED ORDER — SUCCINYLCHOLINE CHLORIDE 20 MG/ML IJ SOLN
INTRAMUSCULAR | Status: DC | PRN
  Administered 2014-01-18: 100 mg via INTRAVENOUS

## 2014-01-18 MED ORDER — LIDOCAINE HCL 4 % MT SOLN
OROMUCOSAL | Status: DC | PRN
  Administered 2014-01-18: 2 mL via TOPICAL

## 2014-01-18 MED ORDER — SODIUM CHLORIDE 0.9 % IV SOLN
INTRAVENOUS | Status: DC | PRN
  Administered 2014-01-18: 10:00:00 via INTRAVENOUS

## 2014-01-18 MED ORDER — PHENYLEPHRINE HCL 10 MG/ML IJ SOLN
10.0000 mg | INTRAVENOUS | Status: DC | PRN
  Administered 2014-01-18: 50 ug/min via INTRAVENOUS

## 2014-01-18 SURGICAL SUPPLY — 1 items: advanix biliary plastic stent 10mmx5cm ×3 IMPLANT

## 2014-01-18 NOTE — Anesthesia Preprocedure Evaluation (Addendum)
Anesthesia Evaluation  Patient identified by MRN, date of birth, ID band Patient awake    Reviewed: Allergy & Precautions, H&P , NPO status , Patient's Chart, lab work & pertinent test results  History of Anesthesia Complications Negative for: history of anesthetic complications  Airway Mallampati: I TM Distance: >3 FB Neck ROM: Full    Dental  (+) Missing, Poor Dentition, Dental Advisory Given   Pulmonary    + decreased breath sounds      Cardiovascular hypertension, Rhythm:Regular Rate:Normal     Neuro/Psych    GI/Hepatic   Endo/Other  diabetes  Renal/GU      Musculoskeletal   Abdominal   Peds  Hematology   Anesthesia Other Findings   Reproductive/Obstetrics                          Anesthesia Physical Anesthesia Plan  ASA: III  Anesthesia Plan: General   Post-op Pain Management:    Induction: Intravenous  Airway Management Planned: Oral ETT  Additional Equipment:   Intra-op Plan:   Post-operative Plan:   Informed Consent: I have reviewed the patients History and Physical, chart, labs and discussed the procedure including the risks, benefits and alternatives for the proposed anesthesia with the patient or authorized representative who has indicated his/her understanding and acceptance.   Dental advisory given  Plan Discussed with: CRNA and Anesthesiologist  Anesthesia Plan Comments:         Anesthesia Quick Evaluation

## 2014-01-18 NOTE — Anesthesia Postprocedure Evaluation (Signed)
  Anesthesia Post-op Note  Patient: Tammy Willis  Procedure(s) Performed: Procedure(s): ENDOSCOPIC RETROGRADE CHOLANGIOPANCREATOGRAPHY (ERCP) (N/A)  Patient Location: Endoscopy Unit  Anesthesia Type:General  Level of Consciousness: awake and alert   Airway and Oxygen Therapy: Patient Spontanous Breathing and Patient connected to nasal cannula oxygen  Post-op Pain: mild  Post-op Assessment: Post-op Vital signs reviewed, Patient's Cardiovascular Status Stable, Respiratory Function Stable, Patent Airway and Pain level controlled  Post-op Vital Signs: stable  Last Vitals:  Filed Vitals:   01/18/14 1147  BP: 150/53  Pulse: 78  Temp:   Resp: 19    Complications: No apparent anesthesia complications

## 2014-01-18 NOTE — Progress Notes (Signed)
[facility] Progress Note   Tammy Willis ZJI:[id number] DOB: 1925-01-21 DOA: 01/11/2014 PCP: Pcp Not In System  Brief narrative: Tammy Willis is a 80 y.o. female  with type 2 diabetes mellitus, hypertension who presents with abdominal pain. She's been having pain after eating for a couple of months now. She decided to come to the hospital due to significant weakness. In the ER she was found to have an ultrasound which was negative for an acute cholecystitis. LFTs are elevated-  alkaline phosphatase is 217. She waas found to have sludge in the [place]. HIDA scan abnormal, she underwent EGD which showed a small hiatal hernia. Surgery consulted and MRCP ordered to evaluate for biliary cholecystitis. But patient refused the MRCP yesterday. Initially scheduled for ERCP today, but cancelled secondary to high BP. MRCP showed large biliary stone winthin CBD with evidence of biliary obstruction with moderate extrahepatic biliary duct dilatation and mild intrahepatic duct dilatation.  Patient agrees to have the procedure done. Plan for tomorrow.      Abdominal pain, other specified site--  Nausea with vomiting-dysphagia-- sludge in gallbladder.  Nausea, vomiting and abdominal pain have resolved. Her liver function panel is normal today.  GI consulted and she underwent HIDA scan which was abnormal.  EGD showed small hiatal hernia.  MRCP showed  showed large biliary stone winthin CBD with evidence of biliary obstruction with moderate extrahepatic biliary duct dilatation and mild intrahepatic duct dilatation.  She was initially scheduled for ERCP, which couldn't be done as her BP shot up to 210/100's. Her BP is better controlled today and she has agreed to undergo the procedure when planned.  She underwent ERCP today and three stones were removed and plan for lithotripsy in 6 to 8 weeks.   Cirrhosis of the liver? Ultrasound reveals nodular contour and heterogeneous areas of increased  echogenicity -Hepatitis panel ordered by GI and is negative.   CKD 3 versus acute renal failure -Last BUN and creatinine that we have is from March 2013 when BUN was 20 and creatinine 1.06 - her creatinine has stabilized at 1.62    Diabetes mellitus -Low dose sliding scale insulin-hold metformin. CBG (last 3)   Recent Labs  01/18/14 0414 01/18/14 0805 01/18/14 1206  GLUCAP 110* 98 206*        Accelerated  HTN (hypertension) -Holding lisinopril/HCTZ because of her renal insufficiency. -IV hydralazine. Added on amlodipine and po scheduled hydralazine.   Mild Thrombocytopenia - recheck in AM, shows improvement.   Elevated lactic acid - this was mild- cont to hydrate  Hypokalemia: - repleted as needed.     Code Status: full code  Family Communication:son in law at bedside.  Disposition Plan: to be determined   Consultants:  GI  Surgery  Procedures: MRCP    Antibiotics: Anti-infectives   Start     Dose/Rate Route Frequency Ordered Stop   01/15/14 1015  Ampicillin-Sulbactam (UNASYN) 3 g in sodium chloride 0.9 % 100 mL IVPB  Status:  Discontinued     3 g 100 mL/hr over 60 Minutes Intravenous  Once 01/15/14 1011 01/15/14 1242   01/11/14 2115  ciprofloxacin (CIPRO) IVPB 400 mg  Status:  Discontinued     400 mg 200 mL/hr over 60 Minutes Intravenous Every 12 hours 01/11/14 2102 01/12/14 1533   01/11/14 2115  metroNIDAZOLE (FLAGYL) IVPB 500 mg  Status:  Discontinued     500 mg 100 mL/hr over 60 Minutes Intravenous Every 8 hours 01/11/14 2102 01/12/14 1533  DVT prophylaxis:  heparin   Objective: Filed Weights   01/15/14 2041 01/16/14 2114 01/17/14 2046  Weight: 42.048 kg (92 lb 11.2 oz) 43.75 kg (96 lb 7.2 oz) 44.3 kg (97 lb 10.6 oz)    Intake/Output Summary (Last 24 hours) at 01/18/14 1333 Last data filed at 01/18/14 1137  Gross per 24 hour  Intake    890 ml  Output      0 ml  Net    890 ml     Vitals Filed Vitals:   01/18/14 1135 01/18/14  1145 01/18/14 1147 01/18/14 1210  BP: 156/60 150/53 150/53 134/95  Pulse: 79 77 78 74  Temp:    97.8 F (36.6 C)  TempSrc:    Oral  Resp: 14 13 19 18   Height:      Weight:      SpO2: 100% 99% 98% 98%    Exam: General: No acute respiratory distress-awake alert Lungs: Clear to auscultation bilaterally without wheezes or crackles Cardiovascular: Regular rate and rhythm without murmur gallop or rub normal S1 and S2 Abdomen:mild tenderness RUQ, nondistended, soft, bowel sounds positive, no rebound, no ascites, no appreciable mass Extremities: No significant cyanosis, clubbing, or edema bilateral lower extremities  Data Reviewed: Basic Metabolic Panel:  Recent Labs Lab 01/12/14 0426 01/13/14 0307 01/14/14 0922 01/15/14 0450 01/18/14 0422  NA 137 143 143 143 139  K 4.3 3.0* 4.1 3.9 3.9  CL 103 109 109 109 106  CO2 21 23 18* 20 21  GLUCOSE 120* 92 69* 78 110*  BUN 19 18 13 12 14   CREATININE 1.53* 1.61* 1.61* 1.62* 1.79*  CALCIUM 9.0 8.2* 8.4 8.5 8.8   Liver Function Tests:  Recent Labs Lab 01/11/14 1713 01/12/14 0426 01/13/14 0307 01/14/14 0922 01/15/14 0450  AST 458* 234* 72* 37 31  ALT 199* 139* 68* 46* 34  ALKPHOS 277* 217* 156* 152* 151*  BILITOT 1.6* 1.2 0.5 0.7 0.5  PROT 7.4 6.3 5.3* 5.7* 5.4*  ALBUMIN 3.0* 2.5* 2.1* 2.3* 2.3*    Recent Labs Lab 01/11/14 1713  LIPASE 15   No results found for this basename: AMMONIA,  in the last 168 hours CBC:  Recent Labs Lab 01/11/14 1713 01/12/14 0426 01/14/14 0922  WBC 10.7* 7.1 3.5*  NEUTROABS 9.8*  --   --   HGB 13.0 12.0 10.3*  HCT 40.3 37.5 32.1*  MCV 91.4 93.8 91.7  PLT 178 146* 164   Cardiac Enzymes: No results found for this basename: CKTOTAL, CKMB, CKMBINDEX, TROPONINI,  in the last 168 hours BNP (last 3 results) No results found for this basename: PROBNP,  in the last 8760 hours CBG:  Recent Labs Lab 01/17/14 2006 01/18/14 0007 01/18/14 0414 01/18/14 0805 01/18/14 1206  GLUCAP 128*  109* 110* 98 206*    No results found for this or any previous visit (from the past 240 hour(s)).   Studies:  Recent x-ray studies have been reviewed in detail by the Attending Physician  Scheduled Meds:  Scheduled Meds: . amLODipine  10 mg Oral Daily  . feeding supplement (RESOURCE BREEZE)  1 Container Oral Q2000  . hydrALAZINE  50 mg Oral 3 times per day  . insulin aspart  0-9 Units Subcutaneous 6 times per day   Continuous Infusions:    Time spent on care of this patient: 25 min   Nasya Vincent, MD 01/18/2014, 1:33 PM  LOS: 7 days   [facility] Office  [phone] Pager - Text Page per [email or web address]  If 7PM-7AM,  please contact night-coverage [email or web address]

## 2014-01-18 NOTE — H&P (View-Only) (Signed)
    BP very high pre-ERCP In consultation with anesthesia have decided to cancel ERCP. Needs better control of BP - is off regular meds  Discussed w/ Dr. Karleen Hampshire  We will regroup over weekend Patient is asx now and is at best ambivalent about procedures/surgery so it may be that we explain risks/benefits Tx or no Tx again and not do procedure  Discussed w/ Dr. Shea Stakes, MD, [facility] [phone] (pager) 01/15/2014 11:32 AM

## 2014-01-18 NOTE — Progress Notes (Signed)
01/18/2014 12:40 PM  Pt returned from ERCP, patient's daughter currently at bedside.  Utilized interpreter to complete assessment, see doc flowsheets for full documentation.  Pt denies pain at this time.  Plan of care reviewed with patient and daughter. No questions at this time. Vitals stable.  Will monitor. Tammy Willis

## 2014-01-18 NOTE — Transfer of Care (Signed)
Immediate Anesthesia Transfer of Care Note  Patient: Tammy Willis  Procedure(s) Performed: Procedure(s): ENDOSCOPIC RETROGRADE CHOLANGIOPANCREATOGRAPHY (ERCP) (N/A)  Patient Location: PACU  Anesthesia Type:General  Level of Consciousness: awake, patient cooperative and lethargic  Airway & Oxygen Therapy: Patient Spontanous Breathing and Patient connected to nasal cannula oxygen  Post-op Assessment: Report given to PACU RN and Post -op Vital signs reviewed and stable  Post vital signs: Reviewed and stable  Complications: No apparent anesthesia complications

## 2014-01-18 NOTE — Interval H&P Note (Signed)
History and Physical Interval Note:  01/18/2014 9:12 AM  Tammy Willis  has presented today for surgery, with the diagnosis of Choledocholithiasis  The various methods of treatment have been discussed with the patient and family. After consideration of risks, benefits and other options for treatment, the patient has consented to  Procedure(s): ENDOSCOPIC RETROGRADE CHOLANGIOPANCREATOGRAPHY (ERCP) (N/A) as a surgical intervention .  The patient's history has been reviewed, patient examined, no change in status, stable for surgery.  I have reviewed the patient's chart and labs.  Questions were answered to the patient's satisfaction.     Pricilla Riffle. Fuller Plan MD

## 2014-01-18 NOTE — Op Note (Signed)
[facility] [place], [id number]   ERCP PROCEDURE REPORT PATIENT: Tammy Willis, Tammy Willis  MR# :[id number] BIRTHDATE: 1924-07-18  GENDER: Female ENDOSCOPIST: Ladene Artist, MD, [facility] REFERRED BY: [facility] PROCEDURE DATE:  01/18/2014 PROCEDURE: ERCP with sphincterotomy, with removal of calculus/calculi and with biliary stent placement ASA CLASS:   Class III INDICATIONS:established bile duct stone(s).   abnormal liver function test.   abdominal pain in upper right quadrant. MEDICATIONS: See Anesthesia Report and General endotracheal anesthesia (GETA) TOPICAL ANESTHETIC: none DESCRIPTION OF PROCEDURE:   After the risks benefits and alternatives of the procedure were thoroughly explained, informed consent was obtained.  The Pentax Ercp Scope [id number]  endoscope was introduced through the mouth  and advanced to the second portion of the duodenum . 1.  There was a medium sized periampullary diverticulum that limited the size of the sphincterotomy 2.  There was dilation of the common bile duct. 3.  Three stones were seen in the mid common bile duct. 4.  Using a stone extraction balloon the bile duct was swept several times.  Two smaller stones and sludge removed from the bile duct successfully. One large stone (14 mm) could not be removed. Good biliary drainage was noted. 5.  With guidewire in the bile duct, a biliary sphincterotomy was performed using the sphincterotome. 6.  Under endoscopic and fluoroscopic guidance a 10 Fr x 5 cm double flap stent was placed in the bile duct with good drainage noted. The scope was then completely withdrawn from the patient and the procedure terminated.  COMPLICATIONS:  ENDOSCOPIC IMPRESSION: 1.   Medium sized periampullary diverticulum. 2.   Dilation of the common bile duct 3.  Three stones in the mid common bile duct; biliary sphincterotomy performed, using a stone extraction balloon the bile  duct was swept several times.  Two stones and sludge removed from the bile duct successfully and the largest stone could not be removed, under endoscopic and fluoroscopic guidance a 10 Fr x 5 cm bilary stent placement performed  RECOMMENDATIONS: 1.  ERCP with stent removal, possible lithotripsy in 6-8 weeks   eSigned:  Ladene Artist, MD, [facility] 01/18/2014 11:06 AM  [C

## 2014-01-18 NOTE — Anesthesia Procedure Notes (Signed)
Procedure Name: Intubation Date/Time: 01/18/2014 9:45 AM Performed by: Trixie Deis A Pre-anesthesia Checklist: Patient identified, Patient being monitored, Emergency Drugs available, Timeout performed and Suction available Patient Re-evaluated:Patient Re-evaluated prior to inductionOxygen Delivery Method: Circle system utilized Preoxygenation: Pre-oxygenation with 100% oxygen Intubation Type: IV induction Ventilation: Mask ventilation without difficulty Laryngoscope Size: Mac and 3 Grade View: Grade I Tube type: Oral Tube size: 7.5 mm Number of attempts: 1 Airway Equipment and Method: Stylet Secured at: 20 cm Tube secured with: Tape Dental Injury: Teeth and Oropharynx as per pre-operative assessment

## 2014-01-19 ENCOUNTER — Encounter (HOSPITAL_COMMUNITY): Payer: Self-pay | Admitting: Gastroenterology

## 2014-01-19 LAB — GLUCOSE, CAPILLARY
GLUCOSE-CAPILLARY: 74 mg/dL (ref 70–99)
GLUCOSE-CAPILLARY: 91 mg/dL (ref 70–99)
GLUCOSE-CAPILLARY: 97 mg/dL (ref 70–99)
Glucose-Capillary: 117 mg/dL — ABNORMAL HIGH (ref 70–99)

## 2014-01-19 LAB — HEMOGLOBIN A1C
Hgb A1c MFr Bld: 6 % — ABNORMAL HIGH (ref <5.7)
Mean Plasma Glucose: 126 mg/dL — ABNORMAL HIGH (ref <117)

## 2014-01-19 MED ORDER — AMLODIPINE BESYLATE 5 MG PO TABS: 5.0000 mg | ORAL_TABLET | Freq: Every day | ORAL | Status: DC

## 2014-01-19 MED ORDER — GLUCERNA SHAKE PO LIQD: 237.0000 mL | Freq: Two times a day (BID) | ORAL | Status: DC

## 2014-01-19 MED ORDER — BOOST / RESOURCE BREEZE PO LIQD: 1.0000 | Freq: Every day | ORAL | Status: DC

## 2014-01-19 NOTE — Discharge Summary (Signed)
Physician Discharge Summary  Tammy Willis JKD:[id number] DOB: 06-Nov-1924 DOA: 01/11/2014  PCP: Pcp Not In System  Admit date: 01/11/2014 Discharge date: 01/19/2014  Time spent: 35 minutes  Recommendations for Outpatient Follow-up:  1. Follow up with PCP in one week 2. Follow up with Gi as recommended.   Discharge Diagnoses:  Principal Problem:   Abdominal pain, other specified site Active Problems:   Diabetes mellitus   HTN (hypertension)   Abnormal LFTs   Dysphagia   Acute renal insufficiency   Choledocholithiasis with obstruction   Nausea with vomiting   Protein-calorie malnutrition, severe   Abdominal pain, right upper quadrant   Discharge Condition: improved.   Diet recommendation: regular   Filed Weights   01/15/14 2041 01/16/14 2114 01/17/14 2046  Weight: 42.048 kg (92 lb 11.2 oz) 43.75 kg (96 lb 7.2 oz) 44.3 kg (97 lb 10.6 oz)    History of present illness:  Tammy Willis is a 80 y.o. female with type 2 diabetes mellitus, hypertension who presents with abdominal pain. She's been having pain after eating for a couple of months now. She decided to come to the hospital due to significant weakness. In the ER she was found to have an ultrasound which was negative for an acute cholecystitis. LFTs are elevated- alkaline phosphatase is 217. She waas found to have sludge in the [place]. HIDA scan abnormal, she underwent EGD which showed a small hiatal hernia. Surgery consulted and MRCP ordered to evaluate for biliary cholecystitis. But patient refused the MRCP yesterday. Initially scheduled for ERCP today, but cancelled secondary to high BP. MRCP showed large biliary stone winthin CBD with evidence of biliary obstruction with moderate extrahepatic biliary duct dilatation and mild intrahepatic duct dilatation. He underwent ERCP ON 7/27 and two stones were removed.    Hospital Course:  Abdominal pain, other specified site-- Nausea with vomiting-dysphagia-- sludge in  gallbladder. Nausea, vomiting and abdominal pain have resolved. Her liver function panel is normal today.  GI consulted and she underwent HIDA scan which was abnormal. EGD showed small hiatal hernia.  MRCP showed showed large biliary stone winthin CBD with evidence of biliary obstruction with moderate extrahepatic biliary duct dilatation and mild intrahepatic duct dilatation.  She was initially scheduled for ERCP, which couldn't be done as her BP shot up to 210/100's. Her BP is better controlled today and she has agreed to undergo the procedure when planned. She underwent ERCP today and three stones were removed and plan for lithotripsy in 6 to 8 weeks.  Cirrhosis of the liver?  Ultrasound reveals nodular contour and heterogeneous areas of increased echogenicity  -Hepatitis panel ordered by GI and is negative.  CKD 3 versus acute renal failure  -Last BUN and creatinine that we have is from March 2013 when BUN was 20 and creatinine 1.06  - her creatinine has stabilized at 1.62  Diabetes mellitus  -Low dose sliding scale insulin. He cbg's were less than 200 during hospitalization without the metformin and glipizide.   Accelerated HTN (hypertension)  -Holding lisinopril/HCTZ because of her renal insufficiency. -IV hydralazine added during the hospitalization. Added on amlodipine. Mild Thrombocytopenia  - recheck in AM, shows improvement.  Elevated lactic acid  Resolved.  Hypokalemia:  - repleted as needed.      Procedures:  mrcp  ERCP  Consultations:  Gastroenterology  Surgery.  Discharge Exam: Filed Vitals:   01/19/14 1451  BP: 137/66  Pulse:   Temp:   Resp:     General: alert afebrile comfortable Cardiovascular: s1s2  Respiratory: ctab  Discharge Instructions You were cared for by a hospitalist during your hospital stay. If you have any questions about your discharge medications or the care you received while you were in the hospital after you are discharged, you can  call the unit and asked to speak with the hospitalist on call if the hospitalist that took care of you is not available. Once you are discharged, your primary care physician will handle any further medical issues. Please note that NO REFILLS for any discharge medications will be authorized once you are discharged, as it is imperative that you return to your primary care physician (or establish a relationship with a primary care physician if you do not have one) for your aftercare needs so that they can reassess your need for medications and monitor your lab values.      Discharge Instructions   Diet Carb Modified    Complete by:  As directed      Discharge instructions    Complete by:  As directed   Follow up with gastroenterology as recommended on 8/18.  Follow up with PCP in one week We have stopped your home blood pressure medications and diabetic medications because of your renal function. Please talk with your doctor about slowly resuming them as indicated. We have ordered [id number] and its pending. Please follow [facility] as recommended.            Medication List    STOP taking these medications       glimepiride 4 MG tablet  Commonly known as:  AMARYL     lisinopril-hydrochlorothiazide 20-25 MG per tablet  Commonly known as:  PRINZIDE,ZESTORETIC     metFORMIN 1000 MG tablet  Commonly known as:  GLUCOPHAGE      TAKE these medications       amLODipine 5 MG tablet  Commonly known as:  NORVASC  Take 1 tablet (5 mg total) by mouth daily.     feeding supplement (RESOURCE BREEZE) Liqd  Take 1 Container by mouth daily at 8 pm.     feeding supplement (GLUCERNA SHAKE) Liqd  Take 237 mLs by mouth 2 (two) times daily between meals.     ibuprofen 100 MG/5ML suspension  Commonly known as:  ADVIL,MOTRIN  Take 200 mg by mouth daily as needed for fever or mild pain.     multivitamins ther. w/minerals Tabs tablet  Take 1 tablet by mouth daily. Centrum silver        No Known Allergies Follow-up Information   Follow up with [facility]    . (you may pick up your medications from the pharmacy. Appointment time 01/21/2014 at 9:00 am  )    Contact information:   [place] [id number] [phone]      Follow up with Silvano Rusk, MD On 02/09/2014.   Specialty:  Gastroenterology   Contact information:   [address] [place] [address] [phone]       Follow up On 02/09/2014.       The results of significant diagnostics from this hospitalization (including imaging, microbiology, ancillary and laboratory) are listed below for reference.    Significant Diagnostic Studies: Ct Head Wo Contrast  01/11/2014   CLINICAL DATA:  Weakness and confusion.  Falls.  EXAM: CT HEAD WITHOUT CONTRAST  TECHNIQUE: Contiguous axial images were obtained from the base of the skull through the vertex without intravenous contrast.  COMPARISON:  08/2012  FINDINGS: There is no evidence of acute cortical infarct, intracranial hemorrhage, mass, midline shift, or extra-axial fluid collection. Moderate cerebral atrophy is unchanged. Patchy and confluent hypodensities in the subcortical and periventricular white matter are similar to the prior study and compatible with moderate chronic small vessel ischemic disease.  Prior left cataract extraction is noted. Mastoid air cells and visualized paranasal sinuses are clear. Mild carotid siphon calcification is noted bilaterally.  IMPRESSION: No evidence of acute intracranial abnormality. Moderate chronic small vessel ischemic disease.   Electronically Signed   By: Logan Bores   On: 01/11/2014 18:55   US Abdomen Complete  01/11/2014   CLINICAL DATA:  Elevated liver function tests.  Abdominal pain.  EXAM: ULTRASOUND ABDOMEN COMPLETE  COMPARISON:  CT of the abdomen and pelvis without contrast 01/09/2011.  FINDINGS: Gallbladder:  There is some echogenic material lying dependently  within the gallbladder which does not shadow, compatible with tumefactive sludge. Gallbladder appears moderately distended. Gallbladder wall is thickened at 6 mm. However, there is no pericholecystic fluid, and per report, the patient did not exhibit a sonographic Murphy's sign on examination.  Common bile duct:  Diameter: 8.5 mm in the porta hepatis.  Liver:  Hepatic echotexture is heterogeneous with multifocal areas of increased echogenicity and slightly nodular contour, suggestive of underlying cirrhosis. Mild prominence of intrahepatic bile ducts.  IVC:  No abnormality visualized.  Pancreas:  Unremarkable.  Spleen:  Size and appearance within normal limits.  6.6 cm in length.  Right Kidney:  Length: 9.2 cm. Echogenicity within normal limits. 3.1 x 1.8 x 1.2 cm anechoic lesion in the parapelvic aspect of the lower pole of the right kidney, compatible with parapelvic cyst. No hydronephrosis visualized.  Left Kidney:  Length: 10.3 cm. Echogenicity within normal limits. In the interpolar region there is a 1.7 x 1.4 x 1.3 cm lesion that is generally anechoic with increased through transmission, but contains a thin internal septation, most compatible with a mildly complex cyst. No hydronephrosis visualized.  Abdominal aorta:  No aneurysm visualized.  Multiple atherosclerotic calcifications.  Other findings:  None.  IMPRESSION: 1. Tumefactive sludge in the gallbladder. The gallbladder wall is mildly thickened, and the gallbladder is moderately distended, however, per report, the patient did not exhibit a sonographic Murphy's sign on examination. Overall, findings are equivocal for acute cholecystitis and clinical correlation is recommended. It is worth noting, however, that the gallbladder wall appeared markedly thickened on remote prior CT examination 01/09/2011, which suggests at this may be chronic in this individual. 2. Mild intra and extrahepatic biliary ductal dilatation. The possibility of sludge in the common  bile duct should be considered. This could be further evaluated with MRCP if clinically appropriate. 3. Nodular liver contour and heterogeneous echotexture of the liver parenchyma, suggestive of cirrhosis. 4. Additional findings, as above.   Electronically Signed   By: Vinnie Langton M.D.   On: 01/11/2014 20:08   Mr Abdomen Mrcp Wo Cm  01/14/2014   CLINICAL DATA:  Ultrasound 01/11/2014, HIDA scan 01/13/2014  EXAM: MRI ABDOMEN WITHOUT  (INCLUDING MRCP)  TECHNIQUE: Multiplanar multisequence MR imaging of the abdomen was performed. Heavily T2-weighted images of the biliary and pancreatic ducts were obtained, and three-dimensional MRCP images were rendered by post processing.  COMPARISON:  Ultrasound 01/11/2014, HIDA scan 01/13/2014, CT 01/09/2011  FINDINGS: There is mild intrahepatic biliary duct dilatation. There is significant dilatation of common hepatic duct and common bile duct. The common bile duct measures 14 mm. There is a large calculus within the  distal common bile duct measuring 14 mm seen on coronal series 8, image 15. There is no significant pancreatic duct dilatation. The gallbladder is distended to 5.4 cm. There is echogenic sludge versus small stones layering within the gallbladder. No significant pericholecystic fluid.  There is no focal hepatic. The pancreatic parenchyma is normal. The spleen adrenal glands normal. There is a cysts within the cortex of left and right kidneys which likely benign. No periportal retroperitoneal adenopathy. No pleural fluid.  IMPRESSION: 1. Large calculus within the distal common bile duct with evidence of biliary obstruction with moderate extrahepatic biliary duct dilatation and mild intrahepatic biliary duct dilatation. 2. The gallbladder is distended with sludge which is also likely related to the obstruction. Evidence of chronic cholecystitis on HIDA scan 3. Biliary obstruction is partial as demonstrated on HIDA of 01/13/2014.   Electronically Signed   By:  Suzy Bouchard M.D.   On: 01/14/2014 08:29   Mr 3d Recon At Scanner  01/14/2014   CLINICAL DATA:  Ultrasound 01/11/2014, HIDA scan 01/13/2014  EXAM: MRI ABDOMEN WITHOUT  (INCLUDING MRCP)  TECHNIQUE: Multiplanar multisequence MR imaging of the abdomen was performed. Heavily T2-weighted images of the biliary and pancreatic ducts were obtained, and three-dimensional MRCP images were rendered by post processing.  COMPARISON:  Ultrasound 01/11/2014, HIDA scan 01/13/2014, CT 01/09/2011  FINDINGS: There is mild intrahepatic biliary duct dilatation. There is significant dilatation of common hepatic duct and common bile duct. The common bile duct measures 14 mm. There is a large calculus within the distal common bile duct measuring 14 mm seen on coronal series 8, image 15. There is no significant pancreatic duct dilatation. The gallbladder is distended to 5.4 cm. There is echogenic sludge versus small stones layering within the gallbladder. No significant pericholecystic fluid.  There is no focal hepatic. The pancreatic parenchyma is normal. The spleen adrenal glands normal. There is a cysts within the cortex of left and right kidneys which likely benign. No periportal retroperitoneal adenopathy. No pleural fluid.  IMPRESSION: 1. Large calculus within the distal common bile duct with evidence of biliary obstruction with moderate extrahepatic biliary duct dilatation and mild intrahepatic biliary duct dilatation. 2. The gallbladder is distended with sludge which is also likely related to the obstruction. Evidence of chronic cholecystitis on HIDA scan 3. Biliary obstruction is partial as demonstrated on HIDA of 01/13/2014.   Electronically Signed   By: Suzy Bouchard M.D.   On: 01/14/2014 08:29   Nm Hepato W/eject Fract  01/13/2014   CLINICAL DATA:  Abdominal pain  EXAM: NUCLEAR MEDICINE HEPATOBILIARY IMAGING WITH GALLBLADDER EF  TECHNIQUE: Sequential images of the abdomen were obtained out to 60 minutes following  intravenous administration of radiopharmaceutical. After slow intravenous infusion of 0.8 micrograms Cholecystokinin, gallbladder ejection fraction was determined.  RADIOPHARMACEUTICALS:  5.0 [place] NW-29F Choletec  COMPARISON:  Ultrasound 01/11/2014.  FINDINGS: There is normal uptake of the tracer by the liver. CBD visualized at 30 min. Gallbladder visualized at 75 min. Post CCK gallbladder ejection fraction -7.7%. At 30 min, normal ejection fraction is greater than 30%.  The patient did not experience symptoms during CCK infusion.  IMPRESSION: No cystic duct obstruction. Gallbladder ejection fraction is only -7.7%. Normal gallbladder ejection fraction should be greater than 30%.   Electronically Signed   By: Lahoma Crocker M.D.   On: 01/13/2014 12:10   Dg Ercp Biliary & Pancreatic Ducts  01/18/2014   CLINICAL DATA:  choledocholithiasis  EXAM: ERCP  TECHNIQUE: Multiple spot images obtained with the  fluoroscopic device and submitted for interpretation post-procedure.  COMPARISON:  MRCP 01/13/2014  FINDINGS: A series fluoroscopic spot images document endoscopic cannulation and opacification of the CBD with demonstration of a large mobile filling defect. Later images document passage of a balloon tipped catheter and placement of a plastic biliary stent.  IMPRESSION: 1. Choledocholithiasis with endoscopic balloon sweep and stent placement.  These images were submitted for radiologic interpretation only. Please see the procedural report for the amount of contrast and the fluoroscopy time utilized.   Electronically Signed   By: Arne Cleveland M.D.   On: 01/18/2014 14:11   Dg Abd Acute W/chest  01/11/2014   CLINICAL DATA:  Abdominal pain.  EXAM: ACUTE ABDOMEN SERIES (ABDOMEN 2 VIEW & CHEST 1 VIEW)  COMPARISON:  Chest x-ray 12/10/2011  FINDINGS: Heart is normal size. Tortuous, calcified aorta. Lungs are clear. No effusions.  Gas within nondistended large and small bowel. No evidence of free air, organomegaly or  suspicious calcification. Moderate stool burden in the colon.  No acute bony abnormality.  IMPRESSION: No evidence of bowel obstruction or free air. Moderate stool burden.  No acute cardiopulmonary disease.   Electronically Signed   By: Rolm Baptise M.D.   On: 01/11/2014 19:08    Microbiology: No results found for this or any previous visit (from the past 240 hour(s)).   Labs: Basic Metabolic Panel:  Recent Labs Lab 01/13/14 0307 01/14/14 0922 01/15/14 0450 01/18/14 0422  NA 143 143 143 139  K 3.0* 4.1 3.9 3.9  CL 109 109 109 106  CO2 23 18* 20 21  GLUCOSE 92 69* 78 110*  BUN 18 13 12 14   CREATININE 1.61* 1.61* 1.62* 1.79*  CALCIUM 8.2* 8.4 8.5 8.8   Liver Function Tests:  Recent Labs Lab 01/13/14 0307 01/14/14 0922 01/15/14 0450  AST 72* 37 31  ALT 68* 46* 34  ALKPHOS 156* 152* 151*  BILITOT 0.5 0.7 0.5  PROT 5.3* 5.7* 5.4*  ALBUMIN 2.1* 2.3* 2.3*   No results found for this basename: LIPASE, AMYLASE,  in the last 168 hours No results found for this basename: AMMONIA,  in the last 168 hours CBC:  Recent Labs Lab 01/14/14 0922  WBC 3.5*  HGB 10.3*  HCT 32.1*  MCV 91.7  PLT 164   Cardiac Enzymes: No results found for this basename: CKTOTAL, CKMB, CKMBINDEX, TROPONINI,  in the last 168 hours BNP: BNP (last 3 results) No results found for this basename: PROBNP,  in the last 8760 hours CBG:  Recent Labs Lab 01/18/14 2004 01/19/14 0001 01/19/14 0409 01/19/14 0745 01/19/14 1149  GLUCAP 104* 97 74 91 117*       Signed:  Kaytelyn Glore  [facility] 01/19/2014, 11:57 PM

## 2014-01-19 NOTE — Progress Notes (Signed)
01/19/2014 8:05 AM  Utilized spanish interpreter via language line to perform assessment.  Pt denies pain at this time.  I answered a few questions from the patient pertaining to her diet and discharge planning.  Pt has no further questions at this time.  Will continue to monitor patient.  Princella Pellegrini

## 2014-01-19 NOTE — Progress Notes (Signed)
          Daily Rounding Note  01/19/2014, 10:33 AM  LOS: 8 days   SUBJECTIVE:       No pain. tolerating clears.  Wants to go home.   OBJECTIVE:         Vital signs in last 24 hours:    Temp:  [97.7 F (36.5 C)-98.9 F (37.2 C)] 98.7 F (37.1 C) (07/28 1023) Pulse Rate:  [74-84] 76 (07/28 1023) Resp:  [12-19] 19 (07/28 1023) BP: (98-166)/(46-95) 98/46 mmHg (07/28 1023) SpO2:  [91 %-100 %] 92 % (07/28 1023) Last BM Date: 01/17/14 General: resting quietly, frail   Heart: RRR Chest: clear bil Abdomen: ND, NT.  Active BS  Extremities: no CCE Neuro/Psych:  No tremor.  Resting quietly, arouseable.  Interpreter not present.   Intake/Output from previous day: 07/27 0701 - 07/28 0700 In: 1010 [P.O.:360; I.V.:650] Out: -   Intake/Output this shift: Total I/O In: 120 [P.O.:120] Out: -   Lab Results: No results found for this basename: WBC, HGB, HCT, PLT,  in the last 72 hours BMET  Recent Labs  01/18/14 0422  NA 139  K 3.9  CL 106  CO2 21  GLUCOSE 110*  BUN 14  CREATININE 1.79*  CALCIUM 8.8   LFT No results found for this basename: PROT, ALBUMIN, AST, ALT, ALKPHOS, BILITOT, BILIDIR, IBILI,  in the last 72 hours PT/INR No results found for this basename: LABPROT, INR,  in the last 72 hours Hepatitis Panel No results found for this basename: HEPBSAG, HCVAB, HEPAIGM, HEPBIGM,  in the last 72 hours  Studies/Results: Dg Ercp Biliary & Pancreatic Ducts  01/18/2014   CLINICAL DATA:  choledocholithiasis  EXAM: ERCP  TECHNIQUE: Multiple spot images obtained with the fluoroscopic device and submitted for interpretation post-procedure.  COMPARISON:  MRCP 01/13/2014  FINDINGS: A series fluoroscopic spot images document endoscopic cannulation and opacification of the CBD with demonstration of a large mobile filling defect. Later images document passage of a balloon tipped catheter and placement of a plastic biliary stent.   IMPRESSION: 1. Choledocholithiasis with endoscopic balloon sweep and stent placement.  These images were submitted for radiologic interpretation only. Please see the procedural report for the amount of contrast and the fluoroscopy time utilized.   Electronically Signed   By: Arne Cleveland M.D.   On: 01/18/2014 14:11    ASSESMENT:   *  Choledocholithiasis.  ERCP 7/27: stones/sludge extracted.  Unable to remove largest stone, a biliary stent was placed.  *  Cirrhotic looking liver on imaging  *  Regurgitation.  EGD 7/22: small  HH, otherwise normal.    PLAN   *  ERCP with stent removal, possible lithotripsy in 6-8 weeks.  Has ROV with GI 8/18 at 10 AM in office.   *  Agree with soft diet.  *  Ok to discharge home.  *  Ordered CA 19-9.     Azucena Freed  01/19/2014, 10:33 AM Pager: [phone]     Attending physician's note   I have taken an interval history, reviewed the chart and examined the patient. I agree with the Advanced Practitioner's note, impression and recommendations. Stable following ERCP/sphincterotomy/stone extractions/biliary stent placement. She had evidence of chronic cholecystitis by abnl CCK-HIDA and if she has persistent symptoms she will need to be re-evaluated by [facility]. OK for discharge from GI standpoint with follow up as outlined.   Pricilla Riffle. Fuller Plan, MD [facility]

## 2014-01-19 NOTE — Progress Notes (Signed)
Interpreter Lesle Chris for Dr Karleen Hampshire and RN Irine Seal

## 2014-01-19 NOTE — Progress Notes (Signed)
CARE MANAGEMENT NOTE 01/19/2014  Patient:  Tammy Willis, Tammy Willis   Account Number:  [id number]  Date Initiated:  01/19/2014  Documentation initiated by:  [facility]  Subjective/Objective Assessment:   Choledocholithiasis     Action/Plan:   lives with dtr, Tammy Willis # [phone]   Anticipated DC Date:  01/19/2014   Anticipated DC Plan:  [place]  CM consult      Choice offered to / List presented to:             Status of service:  Completed, signed off Medicare Important Message given?  NO (If response is "NO", the following Medicare IM given date fields will be blank) Date Medicare IM given:   Medicare IM given by:   Date Additional Medicare IM given:   Additional Medicare IM given by:    Discharge Disposition:  HOME/SELF CARE  Per UR Regulation:    If discussed at Long Length of Stay Meetings, dates discussed:    Comments:  01/19/2014 1445 NCM spoke to [facility] rep and pt does not qualify for charity services. Family will have pay out of pocket for services and they cannot afford at this time $150 for RN visit. MD made aware.  Jonnie Finner RN CCM Case Mgmt phone [phone]  01/19/2014 1245 NCM spoke to dtr, Tammy Willis via interpreter. States her sister Tammy Willis is best contact. Review appt time at [facility] for 7/30 at 9 am. Instructed dtr to pick up pts meds today from [facility]. Pt has orange card but no PCP. Notified [facility] for [facility] RN for home. Jonnie Finner RN CCM Case Mgmt phone [phone]

## 2014-01-19 NOTE — Progress Notes (Signed)
Tammy Willis  Tammy Willis:   - D/C resource breeze - Add Glucerna Shake po BID, each supplement provides 220 kcal and 10 grams of protein - Continue MVI daily - Tammy Willis to Tammy Willis for plan of care  Tammy Willis:   Inadequate oral intake related to clear liquid diet as evidenced by diet order; improving  Tammy Willis:   Tammy Willis to meet >/= 90% of their estimated Tammy needs; not met  Tammy Willis:   Weights, labs, po intake  Tammy Willis:   Tammy Willis with PMH of DM2, HTN, appendectomy, presents to Tammy Willis with son with c/o abdominal pain and lethargy. Per Tammy Willis, plan is to R/O cholecystitis, R/O ulcer, GERD, esophageal stricture. Tammy Willis today, hepatitis serologies, and EGD possible dilation tomorrow.  7/21: - Talked to Tammy Willis with interpreter present  - Tammy Willis has coffee and beans/tortillas for breakfast at home and part of a can of spinach for dinner at 5pm  - Tammy Willis does not eat meat  - Family in room suspects Tammy Willis has been losing weight, unsure how much  - Tammy Willis c/o having nausea/vomiting after eating for the past 3 months  - Tammy Willis visibly cachetic at 86 pounds  7/28: - Per family, Tammy Willis's po intake is getting a little better. Tammy Willis does not like Resource Breeze supplements, but she agreed to try [name]  - Meal completion is recorded as 25%, but she had eaten over half of her lunch today.   Height: Ht Readings from Last 1 Encounters:  01/16/14 5' (1.524 m)    Weight Status:   Wt Readings from Last 1 Encounters:  01/17/14 97 lb 10.6 oz (44.3 kg)    Re-estimated needs:  Kcal: 1200-1400 Protein: 60-75 g Fluid: >1.4 L/day  Skin: intact  Diet Order: Criss Rosales   Intake/Output Summary (Last 24 hours) at 01/19/14 1404 Last data filed at 01/19/14 0900  Gross per 24 hour  Intake    480 ml  Output      0 ml  Net    480 ml    Last BM: 7/28   Labs:   Recent Labs Lab 01/14/14 0922 01/15/14 0450 01/18/14 0422  NA 143 143 139  K 4.1 3.9 3.9  CL 109 109 106  CO2 18* 20 21  BUN _0 CREATININE 1.61*  1.62* 1.79*  CALCIUM 8.4 8.5 8.8  GLUCOSE 69* 78 110*    CBG (last 3)   Recent Labs  01/19/14 0409 01/19/14 0745 01/19/14 1149  GLUCAP 74 91 117*    Scheduled Meds: . feeding supplement (RESOURCE BREEZE)  1 Container Oral Q2000  . insulin aspart  0-9 Units Subcutaneous 6 times per day    Continuous Infusions:   Terrace [place] Tammy Willis, Tammy Willis

## 2014-01-21 ENCOUNTER — Ambulatory Visit: Payer: No Typology Code available for payment source | Attending: Internal Medicine | Admitting: Internal Medicine

## 2014-01-21 ENCOUNTER — Encounter: Payer: Self-pay | Admitting: Internal Medicine

## 2014-01-21 VITALS — BP 140/58 | HR 79 | Temp 98.3°F | Resp 18 | Ht 58.27 in | Wt 98.6 lb

## 2014-01-21 DIAGNOSIS — K8051 Calculus of bile duct without cholangitis or cholecystitis with obstruction: Secondary | ICD-10-CM | POA: Insufficient documentation

## 2014-01-21 DIAGNOSIS — R944 Abnormal results of kidney function studies: Secondary | ICD-10-CM

## 2014-01-21 DIAGNOSIS — R1011 Right upper quadrant pain: Secondary | ICD-10-CM | POA: Insufficient documentation

## 2014-01-21 DIAGNOSIS — E138 Other specified diabetes mellitus with unspecified complications: Secondary | ICD-10-CM

## 2014-01-21 DIAGNOSIS — R799 Abnormal finding of blood chemistry, unspecified: Secondary | ICD-10-CM | POA: Insufficient documentation

## 2014-01-21 DIAGNOSIS — E119 Type 2 diabetes mellitus without complications: Secondary | ICD-10-CM | POA: Insufficient documentation

## 2014-01-21 DIAGNOSIS — E118 Type 2 diabetes mellitus with unspecified complications: Secondary | ICD-10-CM

## 2014-01-21 DIAGNOSIS — I10 Essential (primary) hypertension: Secondary | ICD-10-CM | POA: Insufficient documentation

## 2014-01-21 LAB — GLUCOSE, POCT (MANUAL RESULT ENTRY): POC Glucose: 150 mg/dl — AB (ref 70–99)

## 2014-01-21 NOTE — Progress Notes (Signed)
Patient ID: Tammy Willis, female   DOB: 06-May-1925, 80 y.o.   MRN: [id number]  CC: "Hospital sent for checkup" Establish care  HPI: Tammy Willis presents today for a follow up on severe abdominal pain that started on Sunday the 25th of July. The patient reports her pain was constant in her right upper quadrant but she cannot describe the pain characteristic. Currently she reports her pain as mild in her right upper quadrant.   No Known Allergies Past Medical History  Diagnosis Date  . Hypertension   . Diabetes mellitus   . Choledocholithiasis with obstruction 01/12/2014   Current Outpatient Prescriptions on File Prior to Visit  Medication Sig Dispense Refill  . amLODipine (NORVASC) 5 MG tablet Take 1 tablet (5 mg total) by mouth daily.  30 tablet  1  . feeding supplement, GLUCERNA SHAKE, (GLUCERNA SHAKE) LIQD Take 237 mLs by mouth 2 (two) times daily between meals.    0  . ibuprofen (ADVIL,MOTRIN) 100 MG/5ML suspension Take 200 mg by mouth daily as needed for fever or mild pain.      . Multiple Vitamins-Minerals (MULTIVITAMINS THER. W/MINERALS) TABS Take 1 tablet by mouth daily. Centrum silver       . feeding supplement, RESOURCE BREEZE, (RESOURCE BREEZE) LIQD Take 1 Container by mouth daily at 8 pm.    0   No current facility-administered medications on file prior to visit.   No family history on file. History   Social History  . Marital Status: Single    Spouse Name: N/A    Number of Children: N/A  . Years of Education: N/A   Occupational History  . Not on file.   Social History Main Topics  . Smoking status: Never Smoker   . Smokeless tobacco: Not on file  . Alcohol Use: No  . Drug Use: No  . Sexual Activity: No   Other Topics Concern  . Not on file   Social History Narrative  . No narrative on file    Review of Systems  Constitutional: Negative.   HENT: Negative.   Eyes: Negative.   Respiratory: Negative.   Cardiovascular: Negative.    Gastrointestinal: Positive for abdominal pain and constipation.  Genitourinary: Negative.   Musculoskeletal: Positive for joint pain.  Skin: Negative.  Negative for itching and rash.  Neurological: Positive for dizziness.  Endo/Heme/Allergies: Negative.  Does not bruise/bleed easily.  Psychiatric/Behavioral: Negative.      Objective:    Filed Vitals:   01/21/14 0939  BP: 164/66  Pulse: 79  Temp: 98.3 F (36.8 C)  Resp: 18    Physical Exam  Constitutional: She is oriented to person, place, and time. She appears well-developed and well-nourished.  HENT:  Head: Normocephalic.  Right Ear: Tympanic membrane, external ear and ear canal normal.  Left Ear: Tympanic membrane, external ear and ear canal normal.  Nose: Nose normal.  Mouth/Throat: Uvula is midline, oropharynx is clear and moist and mucous membranes are normal.  Eyes: Conjunctivae, EOM and lids are normal. Pupils are equal, round, and reactive to light.  Neck: No mass and no thyromegaly present.  Cardiovascular: Normal rate.  An irregular rhythm present.  Pulmonary/Chest: Effort normal and breath sounds normal.  Abdominal: Soft. Normal appearance and bowel sounds are normal. There is no tenderness.  Musculoskeletal:       Right knee: Tenderness found.       Left knee: Tenderness found.  Lymphadenopathy:       Head (right side): No submental, no submandibular, no  tonsillar, no preauricular, no posterior auricular and no occipital adenopathy present.       Head (left side): No submental, no submandibular, no tonsillar, no preauricular, no posterior auricular and no occipital adenopathy present.       Right cervical: No superficial cervical, no deep cervical and no posterior cervical adenopathy present.      Left cervical: No superficial cervical, no deep cervical and no posterior cervical adenopathy present.       Right: No supraclavicular and no epitrochlear adenopathy present.       Left: No supraclavicular and no  epitrochlear adenopathy present.  Neurological: She is alert and oriented to person, place, and time. She has normal reflexes. No cranial nerve deficit or sensory deficit.  Skin: Skin is warm, dry and intact. Ecchymosis noted.  Psychiatric: She has a normal mood and affect. Her speech is normal and behavior is normal. Judgment and thought content normal.     Lab Results  Component Value Date   WBC 3.5* 01/14/2014   HGB 10.3* 01/14/2014   HCT 32.1* 01/14/2014   MCV 91.7 01/14/2014   PLT 164 01/14/2014   Lab Results  Component Value Date   CREATININE 1.79* 01/18/2014   BUN 14 01/18/2014   NA 139 01/18/2014   K 3.9 01/18/2014   CL 106 01/18/2014   CO2 21 01/18/2014    Lab Results  Component Value Date   HGBA1C 6.0* 01/19/2014   Lipid Panel  No results found for this basename: chol, trig, hdl, cholhdl, vldl, ldlcalc       Assessment and plan:   Rosanna was seen today for establish care, hospitalization follow-up and abdominal pain.  Diagnoses and associated orders for this visit:  Other specified diabetes mellitus with unspecified complications - Glucose (CBG) Patient d/c'ed metformin and sulfylurea due to kidney disease.  Will manage DM with strict diet control. Abdominal pain, right upper quadrant Patient is scheduled to have ERCP with stent removal and lithotripsy soon.  Decreased GFR - Ambulatory referral to Nephrology Creatinine of 1.79, GFR of 24.  Return in about 3 months (around 04/23/2014) for Hypertension.       Chari Manning, NP-C [facility] [phone] 01/21/2014, 9:51 AM

## 2014-01-21 NOTE — Progress Notes (Signed)
Patient presents to establish care HFU for abdominal pain and weakness Daughter and son-in-law present Interpretation via [place] states they check her CBG twice daily and only give the metformin when CBG is above 200

## 2014-01-21 NOTE — Patient Instructions (Signed)
Plan de alimentacin DASH (DASH Eating Plan) DASH es la sigla en ingls de "Enfoques Alimentarios para Detener la Hipertensin". El plan de alimentacin DASH ha demostrado bajar la presin arterial elevada (hipertensin). Los beneficios adicionales para la salud pueden incluir la disminucin del riesgo de diabetes mellitus tipo2, enfermedades cardacas e ictus. Este plan tambin puede ayudar a Horticulturist, commercial. QU DEBO SABER ACERCA DEL PLAN DE ALIMENTACIN DASH? Para el plan de alimentacin DASH, seguir las siguientes pautas generales:  Elija los alimentos con un valor porcentual diario de sodio de menos del 5% (segn figura en la etiqueta del alimento).  Use hierbas o aderezos sin sal, en lugar de sal de mesa o sal marina.  Consulte al mdico o farmacutico antes de usar sustitutos de la sal.  Coma productos con bajo contenido de sodio, cuya etiqueta suele decir "bajo contenido de sodio" o "sin agregado de sal".  Coma alimentos frescos.  Coma ms verduras, frutas y productos lcteos con bajo contenido de [place].  Elija los cereales integrales. Busque la palabra "integral" en Equities trader de la lista de ingredientes.  Elija el pescado y el pollo o el pavo sin piel ms a menudo que las carnes rojas. Limite el consumo de pescado, carne de ave y carne a 6onzas (170g) por Training and development officer.  Limite el consumo de dulces, postres, azcares y bebidas azucaradas.  Elija las grasas saludables para el corazn.  Limite el consumo de queso a 1onza (28g) por Training and development officer.  Consuma ms comida casera y menos de restaurante, de buf y comida rpida.  Limite el consumo de alimentos fritos.  Cocine los alimentos utilizando mtodos que no sean la fritura.  Limite las verduras enlatadas. Si las consume, enjuguelas bien para disminuir el sodio.  Cuando coma en un restaurante, pida que preparen su comida con menos sal o, en lo posible, sin nada de sal. QU ALIMENTOS PUEDO COMER? Pida ayuda a un nutricionista para  conocer las necesidades calricas individuales. Cereales Pan de salvado o integral. Arroz integral. Pastas de salvado o integrales. Quinua, trigo burgol y cereales integrales. Cereales con bajo contenido de sodio. Tortillas de harina de maz o de salvado. Pan de maz integral. Galletas saladas integrales. Galletas con bajo contenido de [place]. Vegetales Verduras frescas o congeladas (crudas, al vapor, asadas o grilladas). Jugos de tomate y verduras con contenido bajo o reducido de sodio. Pasta y salsa de tomate con contenido bajo o [place]. Verduras enlatadas con bajo contenido de sodio o reducido de sodio.  Lambert Mody Lambert Mody frescas, en conserva (en su jugo natural) o frutas congeladas. Carnes y otros productos con protenas Carne de res molida (al 85% o ms [place]), carne de res de animales alimentados con pastos o carne de res sin la grasa. Pollo o pavo sin piel. Carne de pollo o de [place]. Cerdo sin la grasa. Todos los pescados y frutos de mar. Huevos. Porotos, guisantes o lentejas secos. Frutos secos y semillas sin sal. Frijoles enlatados sin sal. Lcteos Productos lcteos con bajo contenido de grasas, como [place] o al 1%, quesos reducidos en grasas o al 2%, ricota con bajo contenido de grasas o [employer], o yogur natural con bajo contenido de [place]. Quesos con contenido bajo o reducido de sodio. Grasas y Naval architect en barra que no contengan grasas trans. [place]ones[place] y alios para ensaladas livianos o reducidos en grasas (reducidos en sodio). [place]. Aceites de crtamo, oliva o canola. [place] natural de man o almendra. Otros Palomitas de maz y pretzels sin sal.  Los artculos mencionados arriba pueden no ser [employer] de las bebidas o los alimentos recomendados. Comunquese con el nutricionista para conocer ms opciones. QU ALIMENTOS NO SE RECOMIENDAN? Cereales Pan blanco. Pastas blancas. Arroz blanco. Pan de maz refinado. Bagels y  croissants. Galletas saladas que contengan grasas trans. Vegetales Vegetales con crema o fritos. Verduras en [place]. Verduras enlatadas comunes. Pasta y salsa de tomate en lata comunes. Jugos comunes de tomate y de verduras. Lambert Mody Frutas secas. Fruta enlatada en almbar liviano o espeso. Jugo de frutas. Carnes y otros productos con protenas [place] de carne con Lobbyist. Costillas, alas de pollo, tocineta, salchicha, mortadela, salame, chinchulines, tocino, perros calientes, salchichas alemanas y embutidos envasados. Frutos secos y semillas con sal. Frijoles con sal en lata. Lcteos Leche entera o al 2%, crema, mezcla de [place] y crema, y queso crema. Yogur entero o endulzado. Quesos o queso azul con alto contenido de Physicist, medical. Cremas no lcteas y coberturas batidas. Quesos procesados, quesos para untar o cuajadas. Condimentos Sal de cebolla y ajo, sal condimentada, sal de mesa y sal marina. Salsas en lata y envasadas. Salsa [place]. Salsa trtara. Salsa barbacoa. Salsa teriyaki. Salsa de soja, incluso la que tiene contenido reducido de [place]. Salsa de carne. Salsa de pescado. Salsa de [place]. Salsa rosada. Rbano picante. Ketchup y mostaza. Saborizantes y tiernizantes para carne. Caldo en cubitos. Salsa picante. Salsa tabasco. Adobos. Aderezos para tacos. Salsas. Grasas y aceites [place], [place] en barra, Sylvarena de [place], [place], [place] clarificada y Wendee Copp de tocino. Aceites de coco, de palmiste o de palma. Aderezos comunes para ensalada. Otros Pickles y Enterprise. Palomitas de maz y pretzels con sal. Los artculos mencionados arriba pueden no ser [employer] de las bebidas y los alimentos que se Higher education careers adviser. Comunquese con el nutricionista para obtener ms informacin. DNDE Dolan Amen MS INFORMACIN? [place], del Pulmn y de la Sangre (National Heart, Lung, and [place]):  [email or web address] Document Released: 05/31/2011 Document Revised: 10/26/2013 [facility] Patient Information 2015 [place], [place]. This information is not intended to replace advice given to you by your health care provider. Make sure you discuss any questions you have with your health care provider. La diabetes mellitus y los alimentos (Diabetes Mellitus and Food) Es importante que controle su nivel de azcar en la sangre (glucosa). El nivel de glucosa en sangre depende en gran medida de lo que usted come. Comer alimentos saludables en las cantidades [place] a lo largo del Training and development officer, aproximadamente a la misma hora [employer], lo ayudar a Chief Technology Officer su nivel de Multimedia programmer. Tambin puede ayudarlo a retrasar o Patent attorney de la diabetes mellitus. Comer de [employer] saludable incluso puede ayudarlo a Chartered loss adjuster de presin arterial y a Science writer o Theatre manager un peso saludable.  CMO PUEDEN AFECTARME LOS ALIMENTOS? Carbohidratos Los carbohidratos afectan el nivel de glucosa en sangre ms que cualquier otro tipo de alimento. El nutricionista lo ayudar a Teacher, adult education cuntos carbohidratos puede consumir en cada comida y ensearle a contarlos. El recuento de carbohidratos es importante para mantener la glucosa en sangre en un nivel saludable, en especial si utiliza insulina o toma determinados medicamentos para la diabetes mellitus. Alcohol El alcohol puede provocar disminuciones sbitas de la glucosa en sangre (hipoglucemia), en especial si utiliza insulina o toma determinados medicamentos para la diabetes mellitus. La hipoglucemia es una afeccin que puede poner en peligro la vida. Los sntomas de la hipoglucemia (somnolencia, mareos y Data processing manager) son similares a los sntomas de  haber consumido mucho alcohol.  Si el mdico lo autoriza a beber alcohol, hgalo con moderacin y siga estas pautas:  Las mujeres no deben beber ms de un trago por da, y los  hombres no deben beber ms de dos tragos por da. Un trago es igual a:  12 onzas (355 ml) de cerveza  5 onzas de vino (150 ml) de vino  1,5onzas (45ml) de bebidas espirituosas  No beba con el estmago vaco.  Mantngase hidratado. Beba agua, gaseosas dietticas o t helado sin azcar.  Las gaseosas comunes, los jugos y otros refrescos podran contener muchos carbohidratos y se deben contar. QU ALIMENTOS NO SE RECOMIENDAN? Cuando haga las elecciones de alimentos, es importante que recuerde que todos los alimentos son distintos. Algunos tienen menos nutrientes que otros por porcin, aunque podran tener la misma cantidad de caloras o carbohidratos. Es difcil darle al cuerpo lo que necesita cuando consume alimentos con menos nutrientes. Estos son algunos ejemplos de alimentos que debera evitar ya que contienen muchas caloras y carbohidratos, pero pocos nutrientes:  Grasas trans (la mayora de los alimentos procesados incluyen grasas trans en la etiqueta de Informacin nutricional).  Gaseosas comunes.  Jugos.  Caramelos.  Dulces, como tortas, pasteles, rosquillas y galletas.  Comidas fritas. QU ALIMENTOS PUEDO COMER? Consuma alimentos ricos en nutrientes, que nutrirn el cuerpo y lo mantendrn saludable. Los alimentos que debe comer tambin dependern de varios factores, como:  Las caloras que necesita.  Los medicamentos que toma.  Su peso.  El nivel de glucosa en sangre.  El nivel de presin arterial.  El nivel de colesterol. Tambin debe consumir una variedad de alimentos, como:  Protenas, como carne, aves, pescado, tofu, frutos secos y semillas (las protenas de animales magros son mejores).  Frutas.  Verduras.  Productos lcteos, como leche, queso y yogur (descremados son mejores).  Panes, granos, pastas, cereales, arroz y frijoles.  Grasas, como aceite de oliva, margarina sin grasas trans, aceite de canola, aguacate y aceitunas. TODOS LOS QUE  PADECEN DIABETES MELLITUS TIENEN EL MISMO PLAN DE COMIDAS? Dado que todas las personas que padecen diabetes mellitus son distintas, no hay un solo plan de comidas que funcione para todos. Es muy importante que se rena con un nutricionista que lo ayudar a crear un plan de comidas adecuado para usted. Document Released: 09/18/2007 Document Revised: 06/16/2013 ExitCare Patient Information 2015 ExitCare[employer]. This information is not intended to replace advice given to you by your health care provider. Make sure you discuss any questions you have with your health care provider.  

## 2014-01-22 ENCOUNTER — Telehealth: Payer: Self-pay

## 2014-01-22 ENCOUNTER — Other Ambulatory Visit: Payer: Self-pay

## 2014-01-22 ENCOUNTER — Encounter: Payer: Self-pay | Admitting: Gastroenterology

## 2014-01-22 DIAGNOSIS — K8050 Calculus of bile duct without cholangitis or cholecystitis without obstruction: Secondary | ICD-10-CM

## 2014-01-22 NOTE — Telephone Encounter (Signed)
Patient is scheduled for ERCP for 03/29/14 9:30.  With the help of Tammy Willis, [facility] interpreting in [place] the patient's family is notified of the appt with Dr. Fuller Plan for 02/17/14 and the ERCP for 10/5.  They understand the importance of coming for office visit of 02/17/14

## 2014-01-22 NOTE — Telephone Encounter (Signed)
Per ERCP procedure note 01/18/14 patient will need a repeat ERCP for stent removal and possible lithotripsy in 6-8 weeks.  I have discussed with Dr. Fuller Plan and he advised it was ok to wait until his next hospital week in October.  Patient is scheduled for

## 2014-02-09 ENCOUNTER — Ambulatory Visit: Payer: No Typology Code available for payment source | Admitting: Nurse Practitioner

## 2014-02-17 ENCOUNTER — Ambulatory Visit (INDEPENDENT_AMBULATORY_CARE_PROVIDER_SITE_OTHER): Payer: No Typology Code available for payment source | Admitting: Gastroenterology

## 2014-02-17 ENCOUNTER — Other Ambulatory Visit: Payer: Self-pay | Admitting: Gastroenterology

## 2014-02-17 ENCOUNTER — Encounter: Payer: Self-pay | Admitting: Gastroenterology

## 2014-02-17 ENCOUNTER — Other Ambulatory Visit (INDEPENDENT_AMBULATORY_CARE_PROVIDER_SITE_OTHER): Payer: No Typology Code available for payment source

## 2014-02-17 VITALS — BP 160/90 | HR 80 | Ht <= 58 in | Wt 87.6 lb

## 2014-02-17 DIAGNOSIS — K8020 Calculus of gallbladder without cholecystitis without obstruction: Secondary | ICD-10-CM

## 2014-02-17 DIAGNOSIS — K8050 Calculus of bile duct without cholangitis or cholecystitis without obstruction: Secondary | ICD-10-CM

## 2014-02-17 DIAGNOSIS — K5900 Constipation, unspecified: Secondary | ICD-10-CM

## 2014-02-17 LAB — COMPREHENSIVE METABOLIC PANEL
ALK PHOS: 130 U/L — AB (ref 39–117)
ALT: 10 U/L (ref 0–35)
AST: 24 U/L (ref 0–37)
Albumin: 3.3 g/dL — ABNORMAL LOW (ref 3.5–5.2)
BILIRUBIN TOTAL: 0.7 mg/dL (ref 0.2–1.2)
BUN: 12 mg/dL (ref 6–23)
CO2: 27 meq/L (ref 19–32)
CREATININE: 1.2 mg/dL (ref 0.4–1.2)
Calcium: 9.5 mg/dL (ref 8.4–10.5)
Chloride: 105 mEq/L (ref 96–112)
GFR: 46.44 mL/min — AB (ref >60.00)
Glucose, Bld: 141 mg/dL — ABNORMAL HIGH (ref 70–99)
Potassium: 4.3 mEq/L (ref 3.5–5.1)
Sodium: 139 mEq/L (ref 135–145)
TOTAL PROTEIN: 7.1 g/dL (ref 6.0–8.3)

## 2014-02-17 LAB — CBC WITH DIFFERENTIAL/PLATELET
BASOS PCT: 0.5 % (ref 0.0–3.0)
Basophils Absolute: 0 10*3/uL (ref 0.0–0.1)
EOS PCT: 0.9 % (ref 0.0–5.0)
Eosinophils Absolute: 0.1 10*3/uL (ref 0.0–0.7)
HEMATOCRIT: 33.8 % — AB (ref 36.0–46.0)
HEMOGLOBIN: 11.2 g/dL — AB (ref 12.0–15.0)
LYMPHS ABS: 0.9 10*3/uL (ref 0.7–4.0)
Lymphocytes Relative: 14.5 % (ref 12.0–46.0)
MCHC: 33.2 g/dL (ref 30.0–36.0)
MCV: 91.1 fl (ref 78.0–100.0)
MONOS PCT: 4.6 % (ref 3.0–12.0)
Monocytes Absolute: 0.3 10*3/uL (ref 0.1–1.0)
Neutro Abs: 5.1 10*3/uL (ref 1.4–7.7)
Neutrophils Relative %: 79.5 % — ABNORMAL HIGH (ref 43.0–77.0)
PLATELETS: 208 10*3/uL (ref 150.0–400.0)
RBC: 3.71 Mil/uL — AB (ref 3.87–5.11)
RDW: 15.1 % (ref 11.5–15.5)
WBC: 6.4 10*3/uL (ref 4.0–10.5)

## 2014-02-17 NOTE — Patient Instructions (Signed)
Your physician has requested that you go to the basement for the following lab work before leaving today:Cmet, CBC.  You have been scheduled for an ERCP at [facility] on 03/29/14. Please see separate instructions.   Start over the counter Miralax mixing 17 grams in 8 oz of water 1-2 x daily for constipation.   cc: Chari Manning, MD

## 2014-02-17 NOTE — Progress Notes (Signed)
    History of Present Illness: This is an 80 year old female accompanied by her daughter and interpreter. She was  hospitalized cholelithiasis, choledocholithiasis. ERCP performed during hospitalization is below. She relates problems with constipation and occasional crampy abdominal pain since discharge. No other complaints.  ERCP IMPRESSION: 1. Medium sized periampullary diverticulum. 2. Dilation of the common bile duct 3. Three stones in the mid common bile duct; biliary sphincterotomy performed, using a stone extraction balloon the bile duct was swept several times. Two stones and sludge removed from the bile duct successfully and the largest stone could not be removed, under endoscopic and fluoroscopic guidance a 10 Fr x 5 cm bilary stent placement performed.   Current Medications, Allergies, Past Medical History, Past Surgical History, Family History and Social History were reviewed in [employer] record.  Physical Exam: General: Well developed , frail, elderly, no acute distress Head: Normocephalic and atraumatic Eyes:  sclerae anicteric, EOMI Ears: Normal auditory acuity Mouth: No deformity or lesions Lungs: Clear throughout to auscultation Heart: Regular rate and rhythm; no murmurs, rubs or bruits Abdomen: Soft, non tender and non distended. No masses, hepatosplenomegaly or hernias noted. Normal Bowel sounds Musculoskeletal: Symmetrical with no gross deformities  Pulses:  Normal pulses noted Extremities: No clubbing, cyanosis, edema or deformities noted Neurological: Alert oriented x 4, grossly nonfocal Psychological:  Alert and cooperative. Normal mood and affect  Assessment and Recommendations:  1. Choledocholithiasis. Two stones removed at ERCP one larger stone could not be removed and biliary stent was placed. Schedule ERCP with stent removal, stone removal and possible lithotripsy.   2. Chronic cholecystitis and cholelithiasis. General surgery  evaluated the patient during her hospitalization and felt she was not a surgical candidate and she declined surgery at that time as well. Follow up with PCP.  3. Cirrhosis, etiology not determined. Pt and family are not interested in further evaluation at this time. Follow up with PCP.  4. Constipation. Daily laxative. High fiber diet. Increase water intake. Follow up with PCP.

## 2014-03-10 ENCOUNTER — Encounter (HOSPITAL_COMMUNITY): Payer: Self-pay | Admitting: Pharmacy Technician

## 2014-03-25 NOTE — Progress Notes (Signed)
[place] [phone]" spoke in [place]  with daughter to give pre procedure instructions, family verified was clear on instructions. Aware Spanish interpreter to be on site AM of procedure -once arrived in admitting 0730 03-29-14.

## 2014-03-29 ENCOUNTER — Encounter (HOSPITAL_COMMUNITY): Payer: Self-pay | Admitting: Certified Registered"

## 2014-03-29 ENCOUNTER — Emergency Department (HOSPITAL_COMMUNITY): Payer: No Typology Code available for payment source

## 2014-03-29 ENCOUNTER — Encounter (HOSPITAL_COMMUNITY): Payer: Self-pay

## 2014-03-29 ENCOUNTER — Telehealth: Payer: Self-pay

## 2014-03-29 ENCOUNTER — Ambulatory Visit (HOSPITAL_COMMUNITY)
Admission: RE | Admit: 2014-03-29 | Discharge: 2014-03-29 | Disposition: A | Discharge status: Other Acute Inpatient Hospital | Payer: No Typology Code available for payment source | Source: Ambulatory Visit | Attending: Gastroenterology | Admitting: Gastroenterology

## 2014-03-29 ENCOUNTER — Encounter (HOSPITAL_COMMUNITY)
Admission: RE | Disposition: A | Discharge status: Other Acute Inpatient Hospital | Payer: No Typology Code available for payment source | Source: Ambulatory Visit | Attending: Gastroenterology

## 2014-03-29 ENCOUNTER — Emergency Department (HOSPITAL_COMMUNITY)
Admission: EM | Admit: 2014-03-29 | Discharge: 2014-03-29 | Disposition: A | Discharge status: Home / Self Care | Payer: No Typology Code available for payment source | Attending: Emergency Medicine | Admitting: Emergency Medicine

## 2014-03-29 ENCOUNTER — Encounter (HOSPITAL_COMMUNITY): Payer: Self-pay | Admitting: Emergency Medicine

## 2014-03-29 DIAGNOSIS — Z4803 Encounter for change or removal of drains: Secondary | ICD-10-CM | POA: Insufficient documentation

## 2014-03-29 DIAGNOSIS — K8051 Calculus of bile duct without cholangitis or cholecystitis with obstruction: Secondary | ICD-10-CM

## 2014-03-29 DIAGNOSIS — E119 Type 2 diabetes mellitus without complications: Secondary | ICD-10-CM | POA: Insufficient documentation

## 2014-03-29 DIAGNOSIS — Z8719 Personal history of other diseases of the digestive system: Secondary | ICD-10-CM | POA: Insufficient documentation

## 2014-03-29 DIAGNOSIS — K5900 Constipation, unspecified: Secondary | ICD-10-CM | POA: Insufficient documentation

## 2014-03-29 DIAGNOSIS — K8050 Calculus of bile duct without cholangitis or cholecystitis without obstruction: Secondary | ICD-10-CM

## 2014-03-29 DIAGNOSIS — I10 Essential (primary) hypertension: Secondary | ICD-10-CM | POA: Insufficient documentation

## 2014-03-29 DIAGNOSIS — Z538 Procedure and treatment not carried out for other reasons: Secondary | ICD-10-CM | POA: Insufficient documentation

## 2014-03-29 DIAGNOSIS — K8020 Calculus of gallbladder without cholecystitis without obstruction: Secondary | ICD-10-CM

## 2014-03-29 LAB — CBC WITH DIFFERENTIAL/PLATELET
BASOS ABS: 0 10*3/uL (ref 0.0–0.1)
Basophils Relative: 0 % (ref 0–1)
Eosinophils Absolute: 0 10*3/uL (ref 0.0–0.7)
Eosinophils Relative: 0 % (ref 0–5)
HCT: 37.9 % (ref 36.0–46.0)
Hemoglobin: 12 g/dL (ref 12.0–15.0)
LYMPHS ABS: 0.9 10*3/uL (ref 0.7–4.0)
LYMPHS PCT: 17 % (ref 12–46)
MCH: 28.8 pg (ref 26.0–34.0)
MCHC: 31.7 g/dL (ref 30.0–36.0)
MCV: 90.9 fL (ref 78.0–100.0)
Monocytes Absolute: 0.2 10*3/uL (ref 0.1–1.0)
Monocytes Relative: 4 % (ref 3–12)
NEUTROS ABS: 4.3 10*3/uL (ref 1.7–7.7)
NEUTROS PCT: 79 % — AB (ref 43–77)
PLATELETS: 232 10*3/uL (ref 150–400)
RBC: 4.17 MIL/uL (ref 3.87–5.11)
RDW: 13.5 % (ref 11.5–15.5)
WBC: 5.5 10*3/uL (ref 4.0–10.5)

## 2014-03-29 LAB — COMPREHENSIVE METABOLIC PANEL
ALT: 7 U/L (ref 0–35)
AST: 18 U/L (ref 0–37)
Albumin: 3.3 g/dL — ABNORMAL LOW (ref 3.5–5.2)
Alkaline Phosphatase: 160 U/L — ABNORMAL HIGH (ref 39–117)
Anion gap: 11 (ref 5–15)
BILIRUBIN TOTAL: 0.5 mg/dL (ref 0.3–1.2)
BUN: 19 mg/dL (ref 6–23)
CALCIUM: 9.5 mg/dL (ref 8.4–10.5)
CHLORIDE: 104 meq/L (ref 96–112)
CO2: 26 meq/L (ref 19–32)
Creatinine, Ser: 1.2 mg/dL — ABNORMAL HIGH (ref 0.50–1.10)
GFR calc Af Amer: 46 mL/min — ABNORMAL LOW (ref >90)
GFR calc non Af Amer: 39 mL/min — ABNORMAL LOW (ref >90)
Glucose, Bld: 147 mg/dL — ABNORMAL HIGH (ref 70–99)
POTASSIUM: 4.5 meq/L (ref 3.7–5.3)
Sodium: 141 mEq/L (ref 137–147)
Total Protein: 7.7 g/dL (ref 6.0–8.3)

## 2014-03-29 LAB — GLUCOSE, CAPILLARY: GLUCOSE-CAPILLARY: 146 mg/dL — AB (ref 70–99)

## 2014-03-29 LAB — LIPASE, BLOOD: Lipase: 21 U/L (ref 11–59)

## 2014-03-29 LAB — TROPONIN I

## 2014-03-29 SURGERY — CANCELLED PROCEDURE

## 2014-03-29 MED ORDER — ONDANSETRON 8 MG PO TBDP
8.0000 mg | ORAL_TABLET | Freq: Once | ORAL | Status: AC
  Administered 2014-03-29: 8 mg via ORAL
  Filled 2014-03-29: qty 1

## 2014-03-29 MED ORDER — SODIUM CHLORIDE 0.9 % IV SOLN: INTRAVENOUS | Status: DC

## 2014-03-29 MED ORDER — ACETAMINOPHEN 500 MG PO TABS
1000.0000 mg | ORAL_TABLET | Freq: Once | ORAL | Status: AC
  Administered 2014-03-29: 1000 mg via ORAL
  Filled 2014-03-29: qty 2

## 2014-03-29 MED ORDER — CIPROFLOXACIN IN D5W 400 MG/200ML IV SOLN: 400.0000 mg | Freq: Once | INTRAVENOUS | Status: DC

## 2014-03-29 MED ORDER — HYDRALAZINE HCL 50 MG PO TABS
25.0000 mg | ORAL_TABLET | Freq: Three times a day (TID) | ORAL | Status: DC
Start: 2014-03-29 — End: 2014-04-03

## 2014-03-29 MED ORDER — HYDRALAZINE HCL 50 MG PO TABS
50.0000 mg | ORAL_TABLET | Freq: Once | ORAL | Status: AC
  Administered 2014-03-29: 50 mg via ORAL
  Filled 2014-03-29: qty 1

## 2014-03-29 NOTE — ED Notes (Signed)
Patient brought from Endoscopy depart due to hypertension Patient scheduled to have ERCP Patient noted to have SBP > 200 mm Hg, per Endo RN  Patient arrives alert and oriented via wheelchair from Endo dept Patient does not speak English--interpreter at bedside

## 2014-03-29 NOTE — ED Notes (Signed)
Lab at bedside to obtain bloodwork

## 2014-03-29 NOTE — Discharge Instructions (Signed)
Hipertensin (Hypertension) La hipertensin, conocida comnmente como presin arterial alta, se produce cuando la sangre bombea en las arterias con mucha fuerza. Las arterias son los vasos sanguneos que transportan la sangre desde el corazn hacia todas las partes del cuerpo. Una lectura de la presin arterial consiste en un nmero ms alto sobre un nmero ms bajo, por ejemplo, 110/72. El nmero ms alto (presin sistlica) corresponde a la presin interna de las arterias cuando el corazn [place]. El nmero ms bajo (presin diastlica) corresponde a la presin interna de las arterias cuando el corazn se relaja. En condiciones ideales, la presin arterial debe ser inferior a 120/80. La hipertensin fuerza al corazn a trabajar ms para Herbalist. Las arterias pueden estrecharse o ponerse rgidas. La hipertensin conlleva el riesgo de enfermedad cardaca, ictus y otros problemas.  Solis de riesgo de hipertensin son controlables, pero otros no lo son.  [employer] factores de riesgo que usted no puede Chief Technology Officer, se incluyen:   Manufacturing systems engineer. El riesgo es mayor para las Retail banker.  La edad. Los riesgos aumentan con la edad.  El sexo. Antes de los 45aos, los hombres corren ms [employer]. Despus de los 65aos, las mujeres corren ms [employer]. Entre los factores de riesgo que usted puede Chief Technology Officer, se incluyen:  No hacer la cantidad suficiente de actividad fsica o ejercicio.  Tener sobrepeso.  Consumir mucha grasa, azcar, caloras o sal en la dieta.  Beber alcohol en exceso. SIGNOS Y SNTOMAS Por lo general, la hipertensin no causa signos o sntomas. La hipertensin demasiado alta (crisis hipertensiva) puede causar dolor de cabeza, ansiedad, falta de aire y hemorragia nasal. DIAGNSTICO  Para detectar si usted tiene hipertensin, el mdico le medir la presin arterial mientras est sentado, con el brazo  levantado a la altura del corazn. Debe medirla al [facility] veces en el mismo brazo. Determinadas condiciones pueden causar una diferencia de presin arterial entre el brazo izquierdo y Insurance underwriter. El hecho de tener una sola lectura de la presin arterial ms alta que lo normal no significa que Stage manager. En el caso de tener una lectura de la presin arterial con un valor alto, pdale al mdico que la verifique nuevamente. [employer] hipertensin arterial incluye hacer cambios en el estilo de vida y, posiblemente, tomar medicamentos. Un estilo de vida saludable puede ayudar a bajar la presin arterial alta. Quiz deba cambiar algunos hbitos. Los [employer] en el estilo de vida pueden incluir:  Seguir la dieta DASH. Esta dieta tiene un alto contenido de frutas, verduras y Psychologist, prison and probation services. Incluye poca cantidad de sal, carnes rojas y azcares agregados.  Hacer al menos 2horas de actividad fsica enrgica todas las semanas.  Perder peso, si es necesario.  No fumar.  Limitar el consumo de bebidas alcohlicas.  Aprender formas de reducir el estrs. Si los cambios en el estilo de vida no son suficientes para Child psychotherapist la presin arterial, el mdico puede recetarle medicamentos. Quiz necesite tomar ms de uno. Trabaje en conjunto con su mdico para comprender los riesgos y los beneficios. INSTRUCCIONES PARA EL CUIDADO EN EL HOGAR  Haga que le midan de nuevo la presin arterial segn las indicaciones del [place] los medicamentos solamente como se lo haya indicado el mdico. Siga cuidadosamente las indicaciones. Los medicamentos para la presin arterial deben tomarse segn las indicaciones. Los medicamentos pierden eficacia al omitir las dosis. El hecho de omitir  las dosis también aumenta el riesgo de otros problemas. °· No fume. °· Contrólese la presión arterial en su casa según las indicaciones del médico. °SOLICITE ATENCIÓN MÉDICA SI:  °· Piensa  que tiene una reacción alérgica a los medicamentos. °· Tiene mareos o dolores de cabeza con recurrencia. °· Tiene hinchazón en los tobillos. °· Tiene problemas de visión. °SOLICITE ATENCIÓN MÉDICA DE INMEDIATO SI: °· Siente un dolor de cabeza intenso o confusión. °· Siente debilidad inusual, adormecimiento o que se desmayará. °· Siente dolor intenso en el pecho o en el abdomen. °· Vomita repetidas veces. °· Tiene dificultad para respirar. °ASEGÚRESE DE QUE:  °· Comprende estas instrucciones. °· Controlará su afección. °· Recibirá ayuda de inmediato si no mejora o si empeora. °Document Released: 06/11/2005 Document Revised: 10/26/2013 °ExitCare® Patient Information ©2015 [employer]. This information is not intended to replace advice given to you by your health care provider. Make sure you discuss any questions you have with your health care provider. ° °

## 2014-03-29 NOTE — ED Notes (Signed)
MD at bedside. 

## 2014-03-29 NOTE — ED Notes (Signed)
Bed: PF29 Expected date:  Expected time:  Means of arrival:  Comments: Pt from Endo- hypertensive

## 2014-03-29 NOTE — ED Notes (Signed)
Patient asking for CBG to be checked CBG 134

## 2014-03-29 NOTE — ED Provider Notes (Signed)
CSN: [id number]     Arrival date & time 03/29/14  4944 History   First MD Initiated Contact with Patient 03/29/14 1011     Chief Complaint  Patient presents with  . Hypertension     (Consider location/radiation/quality/duration/timing/severity/associated sxs/prior Treatment) Patient is a 80 y.o. female presenting with hypertension. The history is provided by the patient. The history is limited by a language barrier.  Hypertension Pertinent negatives include no abdominal pain and no shortness of breath.   patient was translated by a medical translator. She had a recent admission for biliary stones and had a stent and ERCP. Reportedly returned today to have the stent removed and possibly stone removed. Patient was sent to the ER after endoscopy found a pressure of 269/120. She has a history of high blood pressure. She may have been off her medications. She denies chest pain. No trouble breathing. She denies swelling. She's had some mild constipation for last 3 days. No lightheadedness or dizziness. No confusion.  Past Medical History  Diagnosis Date  . Hypertension   . Diabetes mellitus   . Choledocholithiasis with obstruction 01/12/2014  . Hiatal hernia   . Common bile duct stone    Past Surgical History  Procedure Laterality Date  . Kidney stone surgery    . Cataract extraction Bilateral   . Appendectomy    . Esophagogastroduodenoscopy N/A 01/13/2014    Procedure: ESOPHAGOGASTRODUODENOSCOPY (EGD);  Surgeon: Ladene Artist, MD;  Location: [facility] ENDOSCOPY;  Service: Endoscopy;  Laterality: N/A;  . Ercp N/A 01/18/2014    Procedure: ENDOSCOPIC RETROGRADE CHOLANGIOPANCREATOGRAPHY (ERCP);  Surgeon: Ladene Artist, MD;  Location: [facility] ENDOSCOPY;  Service: Endoscopy;  Laterality: N/A;   History reviewed. No pertinent family history. History  Substance Use Topics  . Smoking status: Never Smoker   . Smokeless tobacco: Never Used  . Alcohol Use: No   OB History   Grav Para Term Preterm  Abortions TAB SAB Ect Mult Living                 Review of Systems  Constitutional: Negative for fever and chills.  Respiratory: Negative for cough and shortness of breath.   Gastrointestinal: Positive for constipation. Negative for nausea, vomiting and abdominal pain.  Genitourinary: Negative for flank pain.  Musculoskeletal: Negative for back pain.  Skin: Negative for wound.  Neurological: Negative for numbness.      Allergies  Review of patient's allergies indicates no known allergies.  Home Medications   Prior to Admission medications   Medication Sig Start Date End Date Taking? Authorizing Provider  amLODipine (NORVASC) 5 MG tablet Take 5 mg by mouth every morning.   Yes Historical Provider, MD  Multiple Vitamins-Minerals (MULTIVITAMINS THER. W/MINERALS) TABS Take 1 tablet by mouth daily. Centrum silver    Yes Historical Provider, MD  polyethylene glycol (MIRALAX / GLYCOLAX) packet Take 17 g by mouth 2 (two) times daily.   Yes Historical Provider, MD  ranitidine (ZANTAC) 300 MG tablet Take 300 mg by mouth at bedtime.   Yes Historical Provider, MD  hydrALAZINE (APRESOLINE) 50 MG tablet Take 0.5 tablets (25 mg total) by mouth 3 (three) times daily. 03/29/14   Jasper Riling. Lleyton Byers, MD   BP 212/69  Pulse 67  Temp(Src) 97.7 F (36.5 C) (Oral)  Resp 22  SpO2 98% Physical Exam  Nursing note and vitals reviewed. Constitutional: She appears well-developed and well-nourished.  HENT:  Head: Normocephalic and atraumatic.  Eyes: EOM are normal. Pupils are equal, round, and reactive to  light.  Neck: Normal range of motion. Neck supple.  Cardiovascular: Normal rate, regular rhythm and normal heart sounds.   No murmur heard. Frequent PVCs on monitor  Pulmonary/Chest: Effort normal and breath sounds normal. No respiratory distress. She has no wheezes. She has no rales.  Abdominal: Soft. Bowel sounds are normal. She exhibits no distension. There is no tenderness. There is no rebound  and no guarding.  Musculoskeletal: Normal range of motion.  Neurological: She is alert. No cranial nerve deficit.  Patient is awake and appears appropriate.  Skin: Skin is warm and dry.  Psychiatric: She has a normal mood and affect. Her speech is normal.    ED Course  Procedures (including critical care time) Labs Review Labs Reviewed  CBC WITH DIFFERENTIAL - Abnormal; Notable for the following:    Neutrophils Relative % 79 (*)    All other components within normal limits  COMPREHENSIVE METABOLIC PANEL - Abnormal; Notable for the following:    Glucose, Bld 147 (*)    Creatinine, Ser 1.20 (*)    Albumin 3.3 (*)    Alkaline Phosphatase 160 (*)    GFR calc non Af Amer 39 (*)    GFR calc Af Amer 46 (*)    All other components within normal limits  LIPASE, BLOOD  TROPONIN I    Imaging Review Dg Chest 2 View  03/29/2014   CLINICAL DATA:  Hypertension. Choledocholithiasis. Possible chronic cholecystitis.  EXAM: CHEST  2 VIEW  COMPARISON:  01/11/2014.  FINDINGS: Mediastinum and hilar structures are normal. Diffuse bilateral chronic interstitial prominence noted suggesting chronic interstitial lung disease. No pleural effusion or pneumothorax. Cardiomegaly, normal pulmonary vascularity. Diffuse osteopenia. Mild compression lower thoracic vertebral body is stable. Biliary stent noted in good anatomic position.  IMPRESSION: 1. Chronic interstitial lung disease. No acute abnormality. Stable cardiomegaly, no CHF. 2. Biliary stent noted in good anatomic position.   Electronically Signed   By: Marcello Moores  Register   On: 03/29/2014 11:50     EKG Interpretation   Date/Time:  Monday March 29 2014 10:04:28 EDT Ventricular Rate:  68 PR Interval:  147 QRS Duration: 76 QT Interval:  401 QTC Calculation: 426 R Axis:   5 Text Interpretation:  Sinus rhythm Ventricular trigeminy Borderline  repolarization abnormality No significant change since last tracing  Confirmed by Alvino Chapel  MD, Ovid Curd  [phone]) on 03/29/2014 1:22:00 PM      MDM   Final diagnoses:  Essential hypertension    Patient presents from endoscopy with hypertension. No chest pain. No sign of end organ damage. Patient may have been off of her medications and recently had some other changes in her medications for renal insufficiency. Blood pressure initially was 270/120. This come down to 200/60 without treatment. Discussed with cardiology and we'll start hydralazine 50 mg 3 times a day and she'll followup with her primary care assumed for further management. Needs to be lowered over days, not hours.    Jasper Riling. Alvino Chapel, MD 03/29/14 1324

## 2014-03-29 NOTE — ED Notes (Signed)
Patient transported to X-ray via Conservation officer, nature called and made aware of need for phlebotomy

## 2014-03-29 NOTE — ED Notes (Signed)
Patient and pt's family updated as to plan of care Patient remains hypertensive, but asymptomatic Family informed of cards consult Side rails up, call bell in reach  Drinks given to family

## 2014-03-29 NOTE — ED Notes (Signed)
Patient's family members informed of need to make and keep f/u appointment tomorrow, per DC papers Rx x 1 reviewed with patient's family members--agree and v/u Patient alert and oriented x 4 and in NAD upon time of DC home

## 2014-03-29 NOTE — Telephone Encounter (Signed)
Message copied by Marlon Pel on Mon Mar 29, 2014 11:14 AM ------      Message from: Lucio Edward T      Created: Mon Mar 29, 2014 10:17 AM       Barbera Setters,            ERCP cancelled today with BP 260/120. Pt sent to [facility] ED and then to follow up with PCP until BP under control. Then ERCP can be rescheduled.             MS ------

## 2014-03-30 LAB — CBG MONITORING, ED: Glucose-Capillary: 134 mg/dL — ABNORMAL HIGH (ref 70–99)

## 2014-04-01 ENCOUNTER — Inpatient Hospital Stay (HOSPITAL_BASED_OUTPATIENT_CLINIC_OR_DEPARTMENT_OTHER)
Admission: EM | Admit: 2014-04-01 | Discharge: 2014-04-03 | DRG: 408 | Disposition: A | Discharge status: Home / Self Care | Payer: Self-pay | Attending: Internal Medicine | Admitting: Internal Medicine

## 2014-04-01 ENCOUNTER — Encounter (HOSPITAL_BASED_OUTPATIENT_CLINIC_OR_DEPARTMENT_OTHER): Payer: Self-pay | Admitting: Emergency Medicine

## 2014-04-01 ENCOUNTER — Inpatient Hospital Stay (HOSPITAL_COMMUNITY): Payer: Self-pay

## 2014-04-01 DIAGNOSIS — I129 Hypertensive chronic kidney disease with stage 1 through stage 4 chronic kidney disease, or unspecified chronic kidney disease: Secondary | ICD-10-CM | POA: Diagnosis present

## 2014-04-01 DIAGNOSIS — R7989 Other specified abnormal findings of blood chemistry: Secondary | ICD-10-CM

## 2014-04-01 DIAGNOSIS — R109 Unspecified abdominal pain: Secondary | ICD-10-CM | POA: Diagnosis present

## 2014-04-01 DIAGNOSIS — R945 Abnormal results of liver function studies: Secondary | ICD-10-CM

## 2014-04-01 DIAGNOSIS — R1010 Upper abdominal pain, unspecified: Secondary | ICD-10-CM

## 2014-04-01 DIAGNOSIS — R1110 Vomiting, unspecified: Secondary | ICD-10-CM | POA: Diagnosis present

## 2014-04-01 DIAGNOSIS — Z79899 Other long term (current) drug therapy: Secondary | ICD-10-CM

## 2014-04-01 DIAGNOSIS — Z681 Body mass index (BMI) 19 or less, adult: Secondary | ICD-10-CM

## 2014-04-01 DIAGNOSIS — E1122 Type 2 diabetes mellitus with diabetic chronic kidney disease: Secondary | ICD-10-CM | POA: Diagnosis present

## 2014-04-01 DIAGNOSIS — R112 Nausea with vomiting, unspecified: Secondary | ICD-10-CM | POA: Diagnosis present

## 2014-04-01 DIAGNOSIS — E43 Unspecified severe protein-calorie malnutrition: Secondary | ICD-10-CM

## 2014-04-01 DIAGNOSIS — K8010 Calculus of gallbladder with chronic cholecystitis without obstruction: Secondary | ICD-10-CM

## 2014-04-01 DIAGNOSIS — I10 Essential (primary) hypertension: Secondary | ICD-10-CM | POA: Diagnosis present

## 2014-04-01 DIAGNOSIS — K449 Diaphragmatic hernia without obstruction or gangrene: Secondary | ICD-10-CM | POA: Diagnosis present

## 2014-04-01 DIAGNOSIS — R1011 Right upper quadrant pain: Secondary | ICD-10-CM

## 2014-04-01 DIAGNOSIS — K8051 Calculus of bile duct without cholangitis or cholecystitis with obstruction: Secondary | ICD-10-CM | POA: Diagnosis present

## 2014-04-01 DIAGNOSIS — K8045 Calculus of bile duct with chronic cholecystitis with obstruction: Principal | ICD-10-CM

## 2014-04-01 DIAGNOSIS — Z23 Encounter for immunization: Secondary | ICD-10-CM

## 2014-04-01 DIAGNOSIS — R1013 Epigastric pain: Secondary | ICD-10-CM | POA: Diagnosis present

## 2014-04-01 DIAGNOSIS — E860 Dehydration: Secondary | ICD-10-CM

## 2014-04-01 DIAGNOSIS — IMO0002 Reserved for concepts with insufficient information to code with codable children: Secondary | ICD-10-CM | POA: Diagnosis present

## 2014-04-01 DIAGNOSIS — N183 Chronic kidney disease, stage 3 unspecified: Secondary | ICD-10-CM | POA: Diagnosis present

## 2014-04-01 DIAGNOSIS — E1165 Type 2 diabetes mellitus with hyperglycemia: Secondary | ICD-10-CM | POA: Diagnosis present

## 2014-04-01 DIAGNOSIS — K8050 Calculus of bile duct without cholangitis or cholecystitis without obstruction: Secondary | ICD-10-CM

## 2014-04-01 DIAGNOSIS — E11649 Type 2 diabetes mellitus with hypoglycemia without coma: Secondary | ICD-10-CM | POA: Diagnosis present

## 2014-04-01 DIAGNOSIS — E1129 Type 2 diabetes mellitus with other diabetic kidney complication: Secondary | ICD-10-CM

## 2014-04-01 HISTORY — DX: Chronic kidney disease, unspecified: N18.9

## 2014-04-01 LAB — CBC WITH DIFFERENTIAL/PLATELET
Basophils Absolute: 0 10*3/uL (ref 0.0–0.1)
Basophils Relative: 0 % (ref 0–1)
EOS ABS: 0 10*3/uL (ref 0.0–0.7)
EOS PCT: 0 % (ref 0–5)
HEMATOCRIT: 36.5 % (ref 36.0–46.0)
Hemoglobin: 11.7 g/dL — ABNORMAL LOW (ref 12.0–15.0)
LYMPHS PCT: 4 % — AB (ref 12–46)
Lymphs Abs: 0.4 10*3/uL — ABNORMAL LOW (ref 0.7–4.0)
MCH: 29.7 pg (ref 26.0–34.0)
MCHC: 32.1 g/dL (ref 30.0–36.0)
MCV: 92.6 fL (ref 78.0–100.0)
MONO ABS: 0.3 10*3/uL (ref 0.1–1.0)
Monocytes Relative: 3 % (ref 3–12)
Neutro Abs: 9.4 10*3/uL — ABNORMAL HIGH (ref 1.7–7.7)
Neutrophils Relative %: 93 % — ABNORMAL HIGH (ref 43–77)
PLATELETS: 213 10*3/uL (ref 150–400)
RBC: 3.94 MIL/uL (ref 3.87–5.11)
RDW: 13.5 % (ref 11.5–15.5)
WBC: 10.1 10*3/uL (ref 4.0–10.5)

## 2014-04-01 LAB — COMPREHENSIVE METABOLIC PANEL
ALBUMIN: 3.3 g/dL — AB (ref 3.5–5.2)
ALT: 6 U/L (ref 0–35)
AST: 17 U/L (ref 0–37)
Alkaline Phosphatase: 165 U/L — ABNORMAL HIGH (ref 39–117)
Anion gap: 15 (ref 5–15)
BILIRUBIN TOTAL: 0.4 mg/dL (ref 0.3–1.2)
BUN: 26 mg/dL — ABNORMAL HIGH (ref 6–23)
CHLORIDE: 98 meq/L (ref 96–112)
CO2: 24 meq/L (ref 19–32)
Calcium: 9.8 mg/dL (ref 8.4–10.5)
Creatinine, Ser: 1.7 mg/dL — ABNORMAL HIGH (ref 0.50–1.10)
GFR calc Af Amer: 30 mL/min — ABNORMAL LOW (ref >90)
GFR, EST NON AFRICAN AMERICAN: 26 mL/min — AB (ref >90)
Glucose, Bld: 265 mg/dL — ABNORMAL HIGH (ref 70–99)
POTASSIUM: 4.2 meq/L (ref 3.7–5.3)
SODIUM: 137 meq/L (ref 137–147)
Total Protein: 7.8 g/dL (ref 6.0–8.3)

## 2014-04-01 LAB — URINALYSIS, ROUTINE W REFLEX MICROSCOPIC
Bilirubin Urine: NEGATIVE
GLUCOSE, UA: 100 mg/dL — AB
Hgb urine dipstick: NEGATIVE
KETONES UR: NEGATIVE mg/dL
Nitrite: NEGATIVE
PH: 5 (ref 5.0–8.0)
Protein, ur: 30 mg/dL — AB
Specific Gravity, Urine: 1.018 (ref 1.005–1.030)
Urobilinogen, UA: 0.2 mg/dL (ref 0.0–1.0)

## 2014-04-01 LAB — GLUCOSE, CAPILLARY
GLUCOSE-CAPILLARY: 122 mg/dL — AB (ref 70–99)
GLUCOSE-CAPILLARY: 184 mg/dL — AB (ref 70–99)
Glucose-Capillary: 236 mg/dL — ABNORMAL HIGH (ref 70–99)
Glucose-Capillary: 54 mg/dL — ABNORMAL LOW (ref 70–99)
Glucose-Capillary: 95 mg/dL (ref 70–99)
Glucose-Capillary: 84 mg/dL (ref 70–99)

## 2014-04-01 LAB — URINE MICROSCOPIC-ADD ON

## 2014-04-01 LAB — LIPASE, BLOOD: LIPASE: 38 U/L (ref 11–59)

## 2014-04-01 MED ORDER — INFLUENZA VAC SPLIT QUAD 0.5 ML IM SUSY
0.5000 mL | PREFILLED_SYRINGE | INTRAMUSCULAR | Status: DC
  Filled 2014-04-01: qty 0.5

## 2014-04-01 MED ORDER — ONDANSETRON HCL 4 MG/2ML IJ SOLN
4.0000 mg | Freq: Once | INTRAMUSCULAR | Status: AC
  Administered 2014-04-01: 4 mg via INTRAVENOUS
  Filled 2014-04-01: qty 2

## 2014-04-01 MED ORDER — MORPHINE SULFATE 2 MG/ML IJ SOLN: 1.0000 mg | INTRAMUSCULAR | Status: DC | PRN

## 2014-04-01 MED ORDER — METOPROLOL TARTRATE 1 MG/ML IV SOLN
5.0000 mg | Freq: Four times a day (QID) | INTRAVENOUS | Status: DC
  Administered 2014-04-01 – 2014-04-02 (×5): 5 mg via INTRAVENOUS
  Filled 2014-04-01 (×7): qty 5

## 2014-04-01 MED ORDER — FENTANYL CITRATE 0.05 MG/ML IJ SOLN
50.0000 ug | Freq: Once | INTRAMUSCULAR | Status: AC
  Administered 2014-04-01: 50 ug via INTRAVENOUS
  Filled 2014-04-01: qty 2

## 2014-04-01 MED ORDER — SODIUM CHLORIDE 0.9 % IV BOLUS (SEPSIS)
500.0000 mL | Freq: Once | INTRAVENOUS | Status: AC
  Administered 2014-04-01: 500 mL via INTRAVENOUS

## 2014-04-01 MED ORDER — ONDANSETRON HCL 4 MG/2ML IJ SOLN: 4.0000 mg | Freq: Four times a day (QID) | INTRAMUSCULAR | Status: DC | PRN

## 2014-04-01 MED ORDER — HYDRALAZINE HCL 20 MG/ML IJ SOLN
10.0000 mg | Freq: Four times a day (QID) | INTRAMUSCULAR | Status: DC | PRN
  Administered 2014-04-02: 10 mg via INTRAVENOUS
  Filled 2014-04-01 (×2): qty 1

## 2014-04-01 MED ORDER — SODIUM CHLORIDE 0.9 % IV SOLN
INTRAVENOUS | Status: DC
  Administered 2014-04-01 – 2014-04-02 (×4): via INTRAVENOUS

## 2014-04-01 MED ORDER — PROMETHAZINE HCL 25 MG/ML IJ SOLN
12.5000 mg | Freq: Once | INTRAMUSCULAR | Status: AC
  Administered 2014-04-01: 12.5 mg via INTRAVENOUS
  Filled 2014-04-01: qty 1

## 2014-04-01 MED ORDER — PANTOPRAZOLE SODIUM 40 MG IV SOLR
40.0000 mg | INTRAVENOUS | Status: DC
  Administered 2014-04-01 – 2014-04-02 (×2): 40 mg via INTRAVENOUS
  Filled 2014-04-01 (×3): qty 40

## 2014-04-01 MED ORDER — INSULIN ASPART 100 UNIT/ML ~~LOC~~ SOLN
0.0000 [IU] | SUBCUTANEOUS | Status: DC
  Administered 2014-04-01: 2 [IU] via SUBCUTANEOUS
  Administered 2014-04-02: 3 [IU] via SUBCUTANEOUS
  Administered 2014-04-02 – 2014-04-03 (×2): 1 [IU] via SUBCUTANEOUS

## 2014-04-01 MED ORDER — SODIUM CHLORIDE 0.9 % IV SOLN: INTRAVENOUS | Status: DC

## 2014-04-01 MED ORDER — PROMETHAZINE HCL 25 MG/ML IJ SOLN: 6.2500 mg | Freq: Four times a day (QID) | INTRAMUSCULAR | Status: DC | PRN

## 2014-04-01 MED ORDER — MORPHINE SULFATE 2 MG/ML IJ SOLN
2.0000 mg | Freq: Once | INTRAMUSCULAR | Status: AC
  Administered 2014-04-01: 2 mg via INTRAVENOUS
  Filled 2014-04-01: qty 1

## 2014-04-01 NOTE — ED Notes (Signed)
Report given to [facility]. 15 min ETA.

## 2014-04-01 NOTE — Progress Notes (Signed)
Hypoglycemic Event  CBG: 54  Treatment: juice/clear liquid dinner tray  Symptoms: hunger  Follow-up CBG: Time:1730 CBG Result:95  Possible Reasons for Event:  NPO status all day until now  Comments/MD notified:n/a    Makalyn Lennox, [name]  Remember to initiate Hypoglycemia Order Set & complete

## 2014-04-01 NOTE — H&P (Signed)
[facility] History and Physical  Tammy Willis IFO:[id number] DOB: February 02, 1925 DOA: 04/01/2014  Referring physician: EDP PCP: Chari Manning, NP   Chief Complaint: vomiting  HPI: Tammy Willis is a 80 y.o. female  With history of choledocholithiasis, sphincterotomy stone extraction and biliary stent 7/15 by Dr. Fuller Plan presents with several days of vomiting and upper abdominal pain. Apparently, the largest stone could not be extracted and the plan was to repeat ERCP and consider lithotripsy. She was scheduled to have ERCP on 03/29/2014, but the procedure was reportedly canceled due to prohibitively high blood pressure. She speaks only [place]. No family members are available. History is obtained via interpreter, but is somewhat limited as patient is uncomfortable and groggy. Patient reports multiple episodes of vomiting. She has been unable to take any of her medications. She is unable to keep anything down including water. She denies fever or chills.  Denies hematemesis. She points to her epigastrium when asked about location of pain. She denies diarrhea. [facility] and [facility] for further treatment and workup. Family members have left and are unavailable.  Lipase is normal. Alkaline phosphatase mildly elevated at 165, but transaminases and bilirubin within normal limits. White blood cell count is normal and patient is afebrile. Blood pressure currently 164/74.  Review of Systems:  Limited, but as above otherwise negative.  Past Medical History  Diagnosis Date  . Hypertension   . Diabetes mellitus   . Choledocholithiasis with obstruction 01/12/2014  . Hiatal hernia   . Common bile duct stone    Past Surgical History  Procedure Laterality Date  . Kidney stone surgery    . Cataract extraction Bilateral   . Appendectomy    . Esophagogastroduodenoscopy N/A 01/13/2014    Procedure: ESOPHAGOGASTRODUODENOSCOPY (EGD);   Surgeon: Ladene Artist, MD;  Location: [facility] ENDOSCOPY;  Service: Endoscopy;  Laterality: N/A;  . Ercp N/A 01/18/2014    Procedure: ENDOSCOPIC RETROGRADE CHOLANGIOPANCREATOGRAPHY (ERCP);  Surgeon: Ladene Artist, MD;  Location: [facility] ENDOSCOPY;  Service: Endoscopy;  Laterality: N/A;   Social History:  reports that she has never smoked. She has never used smokeless tobacco. She reports that she does not drink alcohol or use illicit drugs.  No Known Allergies  Family history: Unable due to patient factors.  Prior to Admission medications   Medication Sig Start Date End Date Taking? Authorizing Provider  amLODipine (NORVASC) 5 MG tablet Take 5 mg by mouth every morning.    Historical Provider, MD  hydrALAZINE (APRESOLINE) 50 MG tablet Take 0.5 tablets (25 mg total) by mouth 3 (three) times daily. 03/29/14   Jasper Riling. Alvino Chapel, MD  Multiple Vitamins-Minerals (MULTIVITAMINS THER. W/MINERALS) TABS Take 1 tablet by mouth daily. Centrum silver     Historical Provider, MD  polyethylene glycol (MIRALAX / GLYCOLAX) packet Take 17 g by mouth 2 (two) times daily.    Historical Provider, MD  ranitidine (ZANTAC) 300 MG tablet Take 300 mg by mouth at bedtime.    Historical Provider, MD   Physical Exam: Filed Vitals:   04/01/14 0215 04/01/14 0425 04/01/14 0609  BP: 185/73 183/70 164/74  Pulse: 80 84 85  Temp: 98.1 F (36.7 C) 98.3 F (36.8 C) 98.4 F (36.9 C)  TempSrc: Oral Oral Oral  Resp: 16 16 18   Weight:   41.096 kg (90 lb 9.6 oz)  SpO2: 100% 98% 100%    Wt Readings from Last 3 Encounters:  04/01/14 41.096 kg (90 lb 9.6 oz)  02/17/14 39.735  kg (87 lb 9.6 oz)  01/21/14 44.725 kg (98 lb 9.6 oz)  BP 164/74  Pulse 85  Temp(Src) 98.4 F (36.9 C) (Oral)  Resp 18  Wt 41.096 kg (90 lb 9.6 oz)  SpO2 100%  General Appearance:    frail elderly Hispanic female. Groggy but arousable. Appears uncomfortable. An emesis bag at her bedside with bilious emesis.   Head:    Normocephalic, without obvious  abnormality, atraumatic  Eyes:    PERRL, conjunctiva/corneas clear, EOM's intact,   Nose:   Nares normal, septum midline, mucosa normal, no drainage    or sinus tenderness  Throat:   dry mucous membranes.   Neck:   Supple, symmetrical, trachea midline, no adenopathy;    thyroid:  no enlargement/tenderness/nodules; no carotid   bruit or JVD  Back:     Symmetric, no curvature, ROM normal, no CVA tenderness  Lungs:     Clear to auscultation bilaterally, respirations unlabored  Chest Wall:    No tenderness or deformity   Heart:    Regular rate and rhythm, S1 and S2 normal, no murmur, rub   or gallop  Abdomen:     Soft, upper abdominal tenderness. No rebound tenderness. Bowel sounds present. Nondistended.   Genitalia:    deferred   Rectal:    deferred   Extremities:   Extremities normal, atraumatic, no cyanosis or edema  Pulses:   2+ and symmetric all extremities  Skin:   poor turgor. No rash.   Lymph nodes:   Cervical, supraclavicular, and axillary nodes normal  Neurologic:   CNII-XII intact, no focal weakness.     psychiatric: Cooperative.         Labs on Admission:  Basic Metabolic Panel:  Recent Labs Lab 03/29/14 1112 04/01/14 0310  NA 141 137  K 4.5 4.2  CL 104 98  CO2 26 24  GLUCOSE 147* 265*  BUN 19 26*  CREATININE 1.20* 1.70*  CALCIUM 9.5 9.8   Liver Function Tests:  Recent Labs Lab 03/29/14 1112 04/01/14 0310  AST 18 17  ALT 7 6  ALKPHOS 160* 165*  BILITOT 0.5 0.4  PROT 7.7 7.8  ALBUMIN 3.3* 3.3*    Recent Labs Lab 03/29/14 1112 04/01/14 0310  LIPASE 21 38   No results found for this basename: AMMONIA,  in the last 168 hours CBC:  Recent Labs Lab 03/29/14 1112 04/01/14 0310  WBC 5.5 10.1  NEUTROABS 4.3 9.4*  HGB 12.0 11.7*  HCT 37.9 36.5  MCV 90.9 92.6  PLT 232 213   Cardiac Enzymes:  Recent Labs Lab 03/29/14 1112  TROPONINI <0.30    BNP (last 3 results) No results found for this basename: PROBNP,  in the last 8760  hours CBG:  Recent Labs Lab 03/29/14 0919 03/29/14 1400 04/01/14 0608  GLUCAP 146* 134* 236*    Radiological Exams on Admission: No results found.   Assessment/Plan Principal Problem:   Nausea with vomiting:  Lipase normal. LFTs remarkable only for slightly elevated alk phos.  WBC normal and afebrile concerned about biliary etiology. Was to have ERCP 10/5, but c Andancelled due to elevate and in in an in and in in and blood pressure. Have consulted [facility] for recs. Will also candhecand in in an in and in in an in andk UA a and EKG. CXR PTA without infiltrate. IVF, NPO, antiemetics, PPI. SCDs only in case procedure. Active Problems:   DM (diabetes mellitus), type 2, uncontrolled, with renal complications: not on outpatient meds  per med rec. Will give SSI for now   H/o Choledocholithiasis with obstruction, s/p ERCP, stone extraction, biliary stent 7/15.   Abdominal pain, epigastric   CKD (chronic kidney disease), stage III accellerated hypertension:  IV metoprolol scheduled and PRN hydralazine  Code Status: full DVT Prophylaxis:  SCDs Family Communication: none available Disposition Plan: inpatient. medsurg  Time spent: 60 minutes  [facility] Pager [phone]

## 2014-04-01 NOTE — Care Management Note (Unsigned)
    Page 1 of 1   04/01/2014     2:26:12 PM CARE MANAGEMENT NOTE 04/01/2014  Patient:  Tammy Willis, Tammy Willis   Account Number:  [id number]  Date Initiated:  04/01/2014  Documentation initiated by:  Makinze Jani  Subjective/Objective Assessment:   Pt adm on 04/01/14 with abd pain, vomiting, HTN.  PTA, pt resides at home with family.  She speaks no [place].     Action/Plan:   For ERCP today.  Will cont to follow for dc needs as pt progresses.  If hospital intrepreter needed please call Office of Inclusion at [phone].   Anticipated DC Date:  04/03/2014   Anticipated DC Plan:  [place]  CM consult      Choice offered to / List presented to:             Status of service:  In process, will continue to follow Medicare Important Message given?   (If response is "NO", the following Medicare IM given date fields will be blank) Date Medicare IM given:   Medicare IM given by:   Date Additional Medicare IM given:   Additional Medicare IM given by:    Discharge Disposition:    Per UR Regulation:  Reviewed for med. necessity/level of care/duration of stay  If discussed at [place] of Stay Meetings, dates discussed:    Comments:

## 2014-04-01 NOTE — Progress Notes (Signed)
04/01/2014 12:24 PM Nursing note Consent obtained for ERCP with in-hospital interpreter. Family at bedside and updated on plan of care. Questions and concerns addressed.  Tammy Willis, [name]

## 2014-04-01 NOTE — ED Provider Notes (Signed)
CSN: [id number]     Arrival date & time 04/01/14  0204 History   First MD Initiated Contact with Patient 04/01/14 0239     Chief Complaint  Patient presents with  . Abdominal Pain     (Consider location/radiation/quality/duration/timing/severity/associated sxs/prior Treatment) HPI This is an 80 year old female who underwent an ERCP in July with removal of 2 stones in the third stone that could not be removed at that time. A biliary stent was placed at that time. She was scheduled for as second ERCP on October 5 but was found to be severely hypertensive and was sent to the ED instead. She was started on hydralazine and discharged home. She is here now with abdominal pain and vomiting that began yesterday evening about 8 PM. The pain is located in the upper abdomen and is intermittent. She states it is like the pain associated with her previous gallstones. The pain is severe at its worst. It is somewhat worse with movement and palpation. She denies diarrhea. She denies fever. She denies abdominal distention.  Past Medical History  Diagnosis Date  . Hypertension   . Diabetes mellitus   . Choledocholithiasis with obstruction 01/12/2014  . Hiatal hernia   . Common bile duct stone    Past Surgical History  Procedure Laterality Date  . Kidney stone surgery    . Cataract extraction Bilateral   . Appendectomy    . Esophagogastroduodenoscopy N/A 01/13/2014    Procedure: ESOPHAGOGASTRODUODENOSCOPY (EGD);  Surgeon: Ladene Artist, MD;  Location: [facility] ENDOSCOPY;  Service: Endoscopy;  Laterality: N/A;  . Ercp N/A 01/18/2014    Procedure: ENDOSCOPIC RETROGRADE CHOLANGIOPANCREATOGRAPHY (ERCP);  Surgeon: Ladene Artist, MD;  Location: [facility] ENDOSCOPY;  Service: Endoscopy;  Laterality: N/A;   History reviewed. No pertinent family history. History  Substance Use Topics  . Smoking status: Never Smoker   . Smokeless tobacco: Never Used  . Alcohol Use: No   OB History   Grav Para Term Preterm Abortions  TAB SAB Ect Mult Living                 Review of Systems  All other systems reviewed and are negative.   Allergies  Review of patient's allergies indicates no known allergies.  Home Medications   Prior to Admission medications   Medication Sig Start Date End Date Taking? Authorizing Provider  amLODipine (NORVASC) 5 MG tablet Take 5 mg by mouth every morning.    Historical Provider, MD  hydrALAZINE (APRESOLINE) 50 MG tablet Take 0.5 tablets (25 mg total) by mouth 3 (three) times daily. 03/29/14   Jasper Riling. Alvino Chapel, MD  Multiple Vitamins-Minerals (MULTIVITAMINS THER. W/MINERALS) TABS Take 1 tablet by mouth daily. Centrum silver     Historical Provider, MD  polyethylene glycol (MIRALAX / GLYCOLAX) packet Take 17 g by mouth 2 (two) times daily.    Historical Provider, MD  ranitidine (ZANTAC) 300 MG tablet Take 300 mg by mouth at bedtime.    Historical Provider, MD   BP 185/73  Pulse 80  Temp(Src) 98.1 F (36.7 C) (Oral)  Resp 16  SpO2 100%  Physical Exam General: Well-developed, well-nourished female in no acute distress; appearance consistent with age of record HENT: normocephalic; atraumatic; poor dentition Eyes: pupils equal, round and reactive to light; extraocular muscles intact; arcus senilis bilaterally Neck: supple Heart: regular rate and rhythm Lungs: clear to auscultation bilaterally Abdomen: soft; nondistended; midabdominal tenderness; no masses or hepatosplenomegaly appreciated; bowel sounds present Extremities: No deformity; full range of motion; pulses normal  Neurologic: Awake, alert; motor function intact in all extremities and symmetric; no facial droop Skin: Warm and dry Psychiatric: Flat affect    ED Course  Procedures (including critical care time)  MDM   Nursing notes and vitals signs, including pulse oximetry, reviewed.  Summary of this visit's results, reviewed by myself:  Labs:  Results for orders placed during the hospital encounter of  04/01/14 (from the past 24 hour(s))  COMPREHENSIVE METABOLIC PANEL     Status: Abnormal   Collection Time    04/01/14  3:10 AM      Result Value Ref Range   Sodium 137  137 - 147 mEq/L   Potassium 4.2  3.7 - 5.3 mEq/L   Chloride 98  96 - 112 mEq/L   CO2 24  19 - 32 mEq/L   Glucose, Bld 265 (*) 70 - 99 mg/dL   BUN 26 (*) 6 - 23 mg/dL   Creatinine, Ser 1.70 (*) 0.50 - 1.10 mg/dL   Calcium 9.8  8.4 - 10.5 mg/dL   Total Protein 7.8  6.0 - 8.3 g/dL   Albumin 3.3 (*) 3.5 - 5.2 g/dL   AST 17  0 - 37 U/L   ALT 6  0 - 35 U/L   Alkaline Phosphatase 165 (*) 39 - 117 U/L   Total Bilirubin 0.4  0.3 - 1.2 mg/dL   GFR calc non Af Amer 26 (*) >90 mL/min   GFR calc Af Amer 30 (*) >90 mL/min   Anion gap 15  5 - 15  CBC WITH DIFFERENTIAL     Status: Abnormal   Collection Time    04/01/14  3:10 AM      Result Value Ref Range   WBC 10.1  4.0 - 10.5 K/uL   RBC 3.94  3.87 - 5.11 MIL/uL   Hemoglobin 11.7 (*) 12.0 - 15.0 g/dL   HCT 36.5  36.0 - 46.0 %   MCV 92.6  78.0 - 100.0 fL   MCH 29.7  26.0 - 34.0 pg   MCHC 32.1  30.0 - 36.0 g/dL   RDW 13.5  11.5 - 15.5 %   Platelets 213  150 - 400 K/uL   Neutrophils Relative % 93 (*) 43 - 77 %   Neutro Abs 9.4 (*) 1.7 - 7.7 K/uL   Lymphocytes Relative 4 (*) 12 - 46 %   Lymphs Abs 0.4 (*) 0.7 - 4.0 K/uL   Monocytes Relative 3  3 - 12 %   Monocytes Absolute 0.3  0.1 - 1.0 K/uL   Eosinophils Relative 0  0 - 5 %   Eosinophils Absolute 0.0  0.0 - 0.7 K/uL   Basophils Relative 0  0 - 1 %   Basophils Absolute 0.0  0.0 - 0.1 K/uL  LIPASE, BLOOD     Status: None   Collection Time    04/01/14  3:10 AM      Result Value Ref Range   Lipase 38  11 - 59 U/L   Will admit to [facility] for further treatment, including likely ERCP.     Wynetta Fines, MD 04/01/14 [phone]

## 2014-04-01 NOTE — Consult Note (Addendum)
[facility] Consult: 11:01 AM 04/01/2014  LOS: 0 days    Referring Provider: Dr C. Conley Canal, [facility]  Primary Care Physician:  Chari Manning, NP Primary Gastroenterologist:  Dr. Fuller Plan    Reason for Consultation:  Nausea, vomiting and abdominal pain.    HPI: Tammy Willis is a 80 y.o. female.  Frail, aged, Spanish only speaking pt.  Diabetic, on no meds.  Hx choledocholithiasias, chronic cholecystitis, cholelithiasis treated with ERCP/sphinct/stone extraction/biliary stent 01/06/14. Periampullary diverticulum noted.  There was large stone retained that Dr Fuller Plan was unable to remove. Surgery felt pt was too frail for cholecystectomy.  Imaging also revealed a new dx of cirrhosis of undetermined etiology.  Set up for repeat ERCP, stent removal, possible lithotripsy on 10/5 but BP 260/120 prevented MD from pursuing the ERCP.  Sent to ED and treated with hydralazine.   Pt developed epigastric abdominal pain and vomiting yesterday.  sxs similar to those of 12/2013.   Admitted from ED.  ALK phos in 160s, otherwise LFTs and lipase normal.  Glucose in 260s.  WBCs normal.   BP 160s-180s/ 70s.  No CT, ultrasound or xrays thus far.     Past Medical History  Diagnosis Date  . Hypertension   . Diabetes mellitus   . Choledocholithiasis with obstruction 01/12/2014  . Hiatal hernia   . Common bile duct stone 12/2013  . CKD (chronic kidney disease) 12/2013.     Past Surgical History  Procedure Laterality Date  . Kidney stone surgery    . Cataract extraction Bilateral   . Appendectomy    . Esophagogastroduodenoscopy N/A 01/13/2014    Procedure: ESOPHAGOGASTRODUODENOSCOPY (EGD);  Surgeon: Ladene Artist, MD;  Location: [facility] ENDOSCOPY;  Service: Endoscopy;  Laterality: N/A;  . Ercp N/A 01/18/2014    Procedure:  ENDOSCOPIC RETROGRADE CHOLANGIOPANCREATOGRAPHY (ERCP);  Surgeon: Ladene Artist, MD;  Location: [facility] ENDOSCOPY;  Service: Endoscopy;  Laterality: N/A;    Prior to Admission medications   Medication Sig Start Date End Date Taking? Authorizing Provider  amLODipine (NORVASC) 5 MG tablet Take 5 mg by mouth every morning.    Historical Provider, MD  hydrALAZINE (APRESOLINE) 50 MG tablet Take 0.5 tablets (25 mg total) by mouth 3 (three) times daily. 03/29/14   Jasper Riling. Alvino Chapel, MD  Multiple Vitamins-Minerals (MULTIVITAMINS THER. W/MINERALS) TABS Take 1 tablet by mouth daily. Centrum silver     Historical Provider, MD  polyethylene glycol (MIRALAX / GLYCOLAX) packet Take 17 g by mouth 2 (two) times daily.    Historical Provider, MD  ranitidine (ZANTAC) 300 MG tablet Take 300 mg by mouth at bedtime.    Historical Provider, MD    Scheduled Meds: . insulin aspart  0-9 Units Subcutaneous 6 times per day  . metoprolol  5 mg Intravenous 4 times per day  . pantoprazole (PROTONIX) IV  40 mg Intravenous Q24H   Infusions: . sodium chloride 125 mL/hr at 04/01/14 0446   PRN Meds: hydrALAZINE, morphine injection, ondansetron (ZOFRAN) IV, promethazine   Allergies as of 04/01/2014  . (No Known Allergies)  History reviewed. No pertinent family history.  History   Social History  . Marital Status: Single    Spouse Name: N/A    Number of Children: 89  . Years of Education: N/A   Occupational History  . Not on file.   Social History Main Topics  . Smoking status: Never Smoker   . Smokeless tobacco: Never Used  . Alcohol Use: No  . Drug Use: No  . Sexual Activity: No   Other Topics Concern  . Not on file   Social History Narrative  . No narrative on file    REVIEW OF SYSTEMS: Constitutional:  8 # weight loss since 12/2013.  Sleeping alot ENT:  No nose bleeds Pulm:  No cough or CV:  No palpitations, no LE edema.  GU:  No hematuria, no frequency GI:  Last emesis 0800 today Heme:  No  unusual bleeding or bruising   Transfusions:  none Neuro:  No headaches, no peripheral tingling or numbness Derm:  No itching, no rash or sores.  Endocrine:  No sweats or chills.  No polyuria or dysuria Immunization:  Not queried Travel:  None beyond local counties in last few months.    PHYSICAL EXAM: Vital signs in last 24 hours: Filed Vitals:   04/01/14 0609  BP: 164/74  Pulse: 85  Temp: 98.4 F (36.9 C)  Resp: 18   Wt Readings from Last 3 Encounters:  04/01/14 41.096 kg (90 lb 9.6 oz)  02/17/14 39.735 kg (87 lb 9.6 oz)  01/21/14 44.725 kg (98 lb 9.6 oz)   General: frail, cachectic hispanic aged female. Head:  No swelling or asymmetry  Eyes:  No icterus or pallor Ears:  Not HOH  Nose:  No discharge Mouth:  Clear, moist.  No lesions, few teeth Neck:  No JVD, no TMG or mass Lungs:  Clear bil but reduced overall.  No cough or dyspnea Heart: RRR.  No MRG Abdomen:  Soft, BS active.  Slight epigastric tenderness.  No mass, no HSM.   Rectal: deferred   Musc/Skeltl: no joint swelling.  + spinal kyphosis.  Extremities:  No pedal or LE edema  Neurologic:  Follows commands.  No tremor, no limb weakness Skin:  No jaundice.  No sores Tattoos:  none Nodes:  No cervical adenopathy.    Psych:  Relaxed.   Intake/Output from previous day:   Intake/Output this shift:    LAB RESULTS:  Recent Labs  03/29/14 1112 04/01/14 0310  WBC 5.5 10.1  HGB 12.0 11.7*  HCT 37.9 36.5  PLT 232 213   BMET Lab Results  Component Value Date   NA 137 04/01/2014   NA 141 03/29/2014   NA 139 02/17/2014   K 4.2 04/01/2014   K 4.5 03/29/2014   K 4.3 02/17/2014   CL 98 04/01/2014   CL 104 03/29/2014   CL 105 02/17/2014   CO2 24 04/01/2014   CO2 26 03/29/2014   CO2 27 02/17/2014   GLUCOSE 265* 04/01/2014   GLUCOSE 147* 03/29/2014   GLUCOSE 141* 02/17/2014   BUN 26* 04/01/2014   BUN 19 03/29/2014   BUN 12 02/17/2014   CREATININE 1.70* 04/01/2014   CREATININE 1.20* 03/29/2014   CREATININE 1.2  02/17/2014   CALCIUM 9.8 04/01/2014   CALCIUM 9.5 03/29/2014   CALCIUM 9.5 02/17/2014   LFT  Recent Labs  03/29/14 1112 04/01/14 0310  PROT 7.7 7.8  ALBUMIN 3.3* 3.3*  AST 18 17  ALT 7 6  ALKPHOS 160* 165*  BILITOT 0.5 0.4   PT/INR Lab Results  Component Value Date   INR 0.99 01/14/2014   INR 1.15 01/12/2014   Lipase     Component Value Date/Time   LIPASE 38 04/01/2014 0310    RADIOLOGY STUDIES: No results found.  ENDOSCOPIC STUDIES: ERCP 12/2013 per HPI  IMPRESSION:   *  Symptomatic gallstones and bile duct stones.  Treated with ERCP/sphinct/stone extraction/stenting 12/2013.  Known retained large CBD stone.    *  Hypertension.  Forced cancellation of 10/5 repeat ERCP.   Now improved after starting hydralazine.   *  DM.    *  CKD.      PLAN:     *  Set her up for ERCP with MAC, at 1045 tomorrow.  Will d/w Dr Fuller Plan if he wants any imaging performed before ERCP.     Azucena Freed  04/01/2014, 11:01 AM Pager: [phone]       Attending physician's note   I have taken a history, examined the patient and reviewed the chart. I agree with the Advanced Practitioner's note, impression and recommendations. Choledocholithiasis with a large CBD stone remaining that could not be removed at prior ERCP and a bilary stent was placed. It does not appear that choledocholithiasis is causing her current symptoms as her LFTs are stable. Her current symptoms could be due to cholelithiasis/chronic cholecystitis-please consult General Surgery for mgmt of this problem. Abd [place] today. BP now under better control. ERCP tomorrow with attempt at stone extraction and/or lithotripsy for her large CBD stone.   Ladene Artist, MD [facility]

## 2014-04-01 NOTE — Progress Notes (Addendum)
INITIAL NUTRITION ASSESSMENT  DOCUMENTATION CODES Per approved criteria  -Severe malnutrition in the context of chronic illness -Underweight   INTERVENTION: Advance diet as medically appropriate, add interventions accordingly  RD to follow for nutrition care plan  NUTRITION DIAGNOSIS: Inadequate oral intake related to inability to eat as evidenced by NPO status  Goal: Pt to meet >/= 90% of their estimated nutrition needs   Monitor:  PO diet advancement & intake, weight, labs, I/O's  Reason for Assessment: BMI < 18.5  80 y.o. female  Admitting Dx: Nausea with vomiting  ASSESSMENT: 80 y.o. Female with history of choledocholithiasis, sphincterotomy stone extraction and biliary stent 7/15; presented with several days of vomiting and upper abdominal pain.  She was scheduled to have ERCP on 03/29/2014, but the procedure was reportedly canceled due to prohibitively high blood pressure.    Patient currently in [place].  For ERCP today.    Patient seen per Clinical Nutrition during previous hospital admission in July 2015 and dx with severe malnutrition in the context of chronic illness.  Pt with hx of nausea & vomiting after eating.  Was receiving [employer] oral nutrition supplements during previous hospital stay.  RD to order once/as able.  Question if pt will need short term nutrition support.  RD unable to complete Nutrition Focused Physical Exam at this time, however, strongly suspect malnutrition is ongoing.  Height: Ht Readings from Last 1 Encounters:  03/29/14 5\' 3"  (1.6 m)    Weight: Wt Readings from Last 1 Encounters:  04/01/14 90 lb 9.6 oz (41.096 kg)    Ideal Body Weight: 115 lb  % Ideal Body Weight: 78%  Wt Readings from Last 10 Encounters:  04/01/14 90 lb 9.6 oz (41.096 kg)  02/17/14 87 lb 9.6 oz (39.735 kg)  01/21/14 98 lb 9.6 oz (44.725 kg)  01/17/14 97 lb 10.6 oz (44.3 kg)  01/17/14 97 lb 10.6 oz (44.3 kg)  01/17/14 97 lb 10.6 oz (44.3 kg)   01/17/14 97 lb 10.6 oz (44.3 kg)  01/17/14 97 lb 10.6 oz (44.3 kg)  09/19/12 99 lb 3.3 oz (45 kg)    Usual Body Weight: 97 lb  % Usual Body Weight: 93%  BMI:  Body mass index is 16.05 kg/(m^2).  Estimated Nutritional Needs: Kcal: 1400-1600 Protein: 60-70 gm Fluid: >/= 1.5 L  Skin: Intact  Diet Order: NPO  EDUCATION NEEDS: -No education needs identified at this time   Intake/Output Summary (Last 24 hours) at 04/01/14 1236 Last data filed at 04/01/14 1200  Gross per 24 hour  Intake      0 ml  Output    150 ml  Net   -150 ml    Labs:   Recent Labs Lab 03/29/14 1112 04/01/14 0310  NA 141 137  K 4.5 4.2  CL 104 98  CO2 26 24  BUN 19 26*  CREATININE 1.20* 1.70*  CALCIUM 9.5 9.8  GLUCOSE 147* 265*    CBG (last 3)   Recent Labs  03/29/14 1400 04/01/14 0608 04/01/14 1125  GLUCAP 134* 236* 184*    Scheduled Meds: . insulin aspart  0-9 Units Subcutaneous 6 times per day  . metoprolol  5 mg Intravenous 4 times per day  . pantoprazole (PROTONIX) IV  40 mg Intravenous Q24H    Continuous Infusions: . sodium chloride 125 mL/hr at 04/01/14 0446    Past Medical History  Diagnosis Date  . Hypertension   . Diabetes mellitus   . Choledocholithiasis with obstruction 01/12/2014  .  Hiatal hernia   . Common bile duct stone 12/2013  . CKD (chronic kidney disease) 12/2013.     Past Surgical History  Procedure Laterality Date  . Kidney stone surgery    . Cataract extraction Bilateral   . Appendectomy    . Esophagogastroduodenoscopy N/A 01/13/2014    Procedure: ESOPHAGOGASTRODUODENOSCOPY (EGD);  Surgeon: Ladene Artist, MD;  Location: [facility] ENDOSCOPY;  Service: Endoscopy;  Laterality: N/A;  . Ercp N/A 01/18/2014    Procedure: ENDOSCOPIC RETROGRADE CHOLANGIOPANCREATOGRAPHY (ERCP);  Surgeon: Ladene Artist, MD;  Location: [facility] ENDOSCOPY;  Service: Endoscopy;  Laterality: N/A;    Arthur Holms, RD, LDN Pager #: [phone] After-Hours Pager #: [phone]

## 2014-04-01 NOTE — ED Notes (Signed)
Pt reports abdominal pain that started yesterday am, vomited x 5, was seen at [facility] Monday for htn

## 2014-04-01 NOTE — ED Notes (Signed)
Pain assessed utilizing interpreter phone.

## 2014-04-01 NOTE — Consult Note (Signed)
Reason for Consult:chronic cholecystitis Referring Physician: Fuller Plan MD  Tammy Willis is an 80 y.o. female.  HPI: Pt admitted to medical service with nausea ,  Vomiting and abdominal pain.  Hx of ERCP 12/2013 for CBD stone with stent since extraction not possible.  Too frail for operative intervention at that time.  Speaks no english.  Admitted yesterday for above symptoms.    Past Medical History  Diagnosis Date  . Hypertension   . Diabetes mellitus   . Choledocholithiasis with obstruction 01/12/2014  . Hiatal hernia   . Common bile duct stone 12/2013  . CKD (chronic kidney disease) 12/2013.     Past Surgical History  Procedure Laterality Date  . Kidney stone surgery    . Cataract extraction Bilateral   . Appendectomy    . Esophagogastroduodenoscopy N/A 01/13/2014    Procedure: ESOPHAGOGASTRODUODENOSCOPY (EGD);  Surgeon: Ladene Artist, MD;  Location: [facility] ENDOSCOPY;  Service: Endoscopy;  Laterality: N/A;  . Ercp N/A 01/18/2014    Procedure: ENDOSCOPIC RETROGRADE CHOLANGIOPANCREATOGRAPHY (ERCP);  Surgeon: Ladene Artist, MD;  Location: [facility] ENDOSCOPY;  Service: Endoscopy;  Laterality: N/A;    History reviewed. No pertinent family history.  Social History:  reports that she has never smoked. She has never used smokeless tobacco. She reports that she does not drink alcohol or use illicit drugs.  Allergies: No Known Allergies  Medications: I have reviewed the patient's current medications.  Results for orders placed during the hospital encounter of 04/01/14 (from the past 48 hour(s))  COMPREHENSIVE METABOLIC PANEL     Status: Abnormal   Collection Time    04/01/14  3:10 AM      Result Value Ref Range   Sodium 137  137 - 147 mEq/L   Potassium 4.2  3.7 - 5.3 mEq/L   Chloride 98  96 - 112 mEq/L   CO2 24  19 - 32 mEq/L   Glucose, Bld 265 (*) 70 - 99 mg/dL   BUN 26 (*) 6 - 23 mg/dL   Creatinine, Ser 1.70 (*) 0.50 - 1.10 mg/dL   Calcium 9.8  8.4 - 10.5 mg/dL   Total Protein  7.8  6.0 - 8.3 g/dL   Albumin 3.3 (*) 3.5 - 5.2 g/dL   AST 17  0 - 37 U/L   ALT 6  0 - 35 U/L   Alkaline Phosphatase 165 (*) 39 - 117 U/L   Total Bilirubin 0.4  0.3 - 1.2 mg/dL   GFR calc non Af Amer 26 (*) >90 mL/min   GFR calc Af Amer 30 (*) >90 mL/min   Comment: (NOTE)     The eGFR has been calculated using the CKD EPI equation.     This calculation has not been validated in all clinical situations.     eGFR's persistently <90 mL/min signify possible Chronic Kidney     Disease.   Anion gap 15  5 - 15  CBC WITH DIFFERENTIAL     Status: Abnormal   Collection Time    04/01/14  3:10 AM      Result Value Ref Range   WBC 10.1  4.0 - 10.5 K/uL   RBC 3.94  3.87 - 5.11 MIL/uL   Hemoglobin 11.7 (*) 12.0 - 15.0 g/dL   HCT 36.5  36.0 - 46.0 %   MCV 92.6  78.0 - 100.0 fL   MCH 29.7  26.0 - 34.0 pg   MCHC 32.1  30.0 - 36.0 g/dL   RDW 13.5  11.5 -  15.5 %   Platelets 213  150 - 400 K/uL   Neutrophils Relative % 93 (*) 43 - 77 %   Neutro Abs 9.4 (*) 1.7 - 7.7 K/uL   Lymphocytes Relative 4 (*) 12 - 46 %   Lymphs Abs 0.4 (*) 0.7 - 4.0 K/uL   Monocytes Relative 3  3 - 12 %   Monocytes Absolute 0.3  0.1 - 1.0 K/uL   Eosinophils Relative 0  0 - 5 %   Eosinophils Absolute 0.0  0.0 - 0.7 K/uL   Basophils Relative 0  0 - 1 %   Basophils Absolute 0.0  0.0 - 0.1 K/uL  LIPASE, BLOOD     Status: None   Collection Time    04/01/14  3:10 AM      Result Value Ref Range   Lipase 38  11 - 59 U/L  GLUCOSE, CAPILLARY     Status: Abnormal   Collection Time    04/01/14  6:08 AM      Result Value Ref Range   Glucose-Capillary 236 (*) 70 - 99 mg/dL  GLUCOSE, CAPILLARY     Status: Abnormal   Collection Time    04/01/14 11:25 AM      Result Value Ref Range   Glucose-Capillary 184 (*) 70 - 99 mg/dL   Comment 1 Notify RN    URINALYSIS, ROUTINE W REFLEX MICROSCOPIC     Status: Abnormal   Collection Time    04/01/14 12:02 PM      Result Value Ref Range   Color, Urine YELLOW  YELLOW   APPearance CLOUDY  (*) CLEAR   Specific Gravity, Urine 1.018  1.005 - 1.030   pH 5.0  5.0 - 8.0   Glucose, UA 100 (*) NEGATIVE mg/dL   Hgb urine dipstick NEGATIVE  NEGATIVE   Bilirubin Urine NEGATIVE  NEGATIVE   Ketones, ur NEGATIVE  NEGATIVE mg/dL   Protein, ur 30 (*) NEGATIVE mg/dL   Urobilinogen, UA 0.2  0.0 - 1.0 mg/dL   Nitrite NEGATIVE  NEGATIVE   Leukocytes, UA SMALL (*) NEGATIVE  URINE MICROSCOPIC-ADD ON     Status: Abnormal   Collection Time    04/01/14 12:02 PM      Result Value Ref Range   Squamous Epithelial / LPF FEW (*) RARE   WBC, UA 3-6  <3 WBC/hpf   RBC / HPF 0-2  <3 RBC/hpf   Bacteria, UA RARE  RARE   Casts GRANULAR CAST (*) NEGATIVE   Comment: HYALINE CASTS   Crystals CA OXALATE CRYSTALS (*) NEGATIVE  GLUCOSE, CAPILLARY     Status: Abnormal   Collection Time    04/01/14  5:05 PM      Result Value Ref Range   Glucose-Capillary 54 (*) 70 - 99 mg/dL   Comment 1 Documented in Chart     Comment 2 Notify RN    GLUCOSE, CAPILLARY     Status: None   Collection Time    04/01/14  5:36 PM      Result Value Ref Range   Glucose-Capillary 95  70 - 99 mg/dL   Comment 1 Documented in Chart     Comment 2 Notify RN      US Abdomen Complete  04/01/2014   CLINICAL DATA:  Acute epigastric pain.  EXAM: ULTRASOUND ABDOMEN COMPLETE  COMPARISON:  None.  FINDINGS: Gallbladder: Multiple gallstones are noted with associated sludge. Severe gallbladder wall thickening is noted measuring 6 mm. No pericholecystic fluid is noted. No  sonographic Murphy's sign is noted.  Common bile duct: Diameter: 3.4 mm which is within normal limits.  Liver: Heterogeneous echotexture of parenchyma is noted suggesting diffuse hepatocellular disease.  IVC: No abnormality visualized.  Pancreas: Visualized portion unremarkable.  Spleen: Not visualized due to overlying bowel gas.  Right Kidney: Length: 9.4 cm. 2.7 cm cyst seen in lower pole. Echogenicity within normal limits. No mass or hydronephrosis visualized.  Left Kidney:Not  visualized due to overlying bowel gas.  Abdominal aorta: No aneurysm visualized.  Other findings: None.  IMPRESSION: Heterogeneous echotexture hepatic parenchyma is noted consistent with diffuse hepatocellular disease.  Cholelithiasis is noted with associated sludge and significant gallbladder wall thickening. This is concerning for acute cholecystitis. HIDA scan may be performed for further evaluation. These results will be called to the ordering clinician or representative by the Radiologist Assistant, and communication documented in the PACS or zVision Dashboard.   Electronically Signed   By: Sabino Dick M.D.   On: 04/01/2014 15:53    Review of Systems  Unable to perform ROS  Blood pressure 132/78, pulse 63, temperature 97.9 F (36.6 C), temperature source Oral, resp. rate 18, weight 90 lb 9.6 oz (41.096 kg), SpO2 99.00%. Physical Exam  HENT:  Mouth/Throat:    Eyes: EOM are normal. No scleral icterus.  Cardiovascular: Normal rate.   Respiratory: Effort normal and breath sounds normal.  GI: Soft. Bowel sounds are normal. There is tenderness. There is negative Murphy's sign.    Assessment/Plan: CBD stone s/p ERCP and stent   Chronic cholecystitis currently no Murphy's sign on exam Currently non tender and nonpalpable GB Recommend HIDA/ percutaneous drainage if cholecystitis suspected.  Not an operative candidate at this point due to frail state and significant chronic inflammation.    Noel Henandez A. 04/01/2014, 6:35 PM

## 2014-04-01 NOTE — ED Notes (Signed)
Pt had ercp scheduled for 10/5 but it was cancelled due to elevated htn, pt was seen at [facility] for htn on that same day, but has not been able to get ercp rescheduled

## 2014-04-01 NOTE — ED Notes (Signed)
Report called to [facility] ([id number]) prior to transfer.

## 2014-04-02 ENCOUNTER — Encounter (HOSPITAL_COMMUNITY): Payer: Self-pay | Admitting: Anesthesiology

## 2014-04-02 ENCOUNTER — Inpatient Hospital Stay (HOSPITAL_COMMUNITY): Payer: No Typology Code available for payment source

## 2014-04-02 ENCOUNTER — Encounter (HOSPITAL_COMMUNITY): Payer: Self-pay | Admitting: *Deleted

## 2014-04-02 ENCOUNTER — Inpatient Hospital Stay (HOSPITAL_COMMUNITY): Payer: Self-pay | Admitting: Anesthesiology

## 2014-04-02 ENCOUNTER — Encounter (HOSPITAL_COMMUNITY)
Admission: EM | Disposition: A | Discharge status: Home / Self Care | Payer: Self-pay | Source: Home / Self Care | Attending: Internal Medicine

## 2014-04-02 DIAGNOSIS — R1010 Upper abdominal pain, unspecified: Secondary | ICD-10-CM

## 2014-04-02 DIAGNOSIS — N183 Chronic kidney disease, stage 3 (moderate): Secondary | ICD-10-CM

## 2014-04-02 DIAGNOSIS — E43 Unspecified severe protein-calorie malnutrition: Secondary | ICD-10-CM

## 2014-04-02 DIAGNOSIS — E1129 Type 2 diabetes mellitus with other diabetic kidney complication: Secondary | ICD-10-CM

## 2014-04-02 DIAGNOSIS — I10 Essential (primary) hypertension: Secondary | ICD-10-CM

## 2014-04-02 DIAGNOSIS — R1011 Right upper quadrant pain: Secondary | ICD-10-CM

## 2014-04-02 DIAGNOSIS — R7989 Other specified abnormal findings of blood chemistry: Secondary | ICD-10-CM

## 2014-04-02 DIAGNOSIS — K8045 Calculus of bile duct with chronic cholecystitis with obstruction: Principal | ICD-10-CM

## 2014-04-02 DIAGNOSIS — E1165 Type 2 diabetes mellitus with hyperglycemia: Secondary | ICD-10-CM

## 2014-04-02 HISTORY — PX: LITHOTRIPSY: SHX5546

## 2014-04-02 HISTORY — PX: GASTROINTESTINAL STENT REMOVAL: SHX6384

## 2014-04-02 HISTORY — PX: ERCP: SHX5425

## 2014-04-02 LAB — CBC
HEMATOCRIT: 31.5 % — AB (ref 36.0–46.0)
Hemoglobin: 9.9 g/dL — ABNORMAL LOW (ref 12.0–15.0)
MCH: 28.9 pg (ref 26.0–34.0)
MCHC: 31.4 g/dL (ref 30.0–36.0)
MCV: 91.8 fL (ref 78.0–100.0)
Platelets: 179 10*3/uL (ref 150–400)
RBC: 3.43 MIL/uL — ABNORMAL LOW (ref 3.87–5.11)
RDW: 14 % (ref 11.5–15.5)
WBC: 4.3 10*3/uL (ref 4.0–10.5)

## 2014-04-02 LAB — COMPREHENSIVE METABOLIC PANEL
ALT: 5 U/L (ref 0–35)
ANION GAP: 8 (ref 5–15)
AST: 14 U/L (ref 0–37)
Albumin: 2.3 g/dL — ABNORMAL LOW (ref 3.5–5.2)
Alkaline Phosphatase: 105 U/L (ref 39–117)
BUN: 16 mg/dL (ref 6–23)
CALCIUM: 8.5 mg/dL (ref 8.4–10.5)
CO2: 22 mEq/L (ref 19–32)
Chloride: 109 mEq/L (ref 96–112)
Creatinine, Ser: 1.3 mg/dL — ABNORMAL HIGH (ref 0.50–1.10)
GFR calc non Af Amer: 36 mL/min — ABNORMAL LOW (ref >90)
GFR, EST AFRICAN AMERICAN: 42 mL/min — AB (ref >90)
Glucose, Bld: 109 mg/dL — ABNORMAL HIGH (ref 70–99)
Potassium: 4.2 mEq/L (ref 3.7–5.3)
Sodium: 139 mEq/L (ref 137–147)
TOTAL PROTEIN: 5.7 g/dL — AB (ref 6.0–8.3)
Total Bilirubin: 0.4 mg/dL (ref 0.3–1.2)

## 2014-04-02 LAB — GLUCOSE, CAPILLARY
GLUCOSE-CAPILLARY: 137 mg/dL — AB (ref 70–99)
Glucose-Capillary: 116 mg/dL — ABNORMAL HIGH (ref 70–99)
Glucose-Capillary: 118 mg/dL — ABNORMAL HIGH (ref 70–99)
Glucose-Capillary: 181 mg/dL — ABNORMAL HIGH (ref 70–99)
Glucose-Capillary: 217 mg/dL — ABNORMAL HIGH (ref 70–99)

## 2014-04-02 IMAGING — CR DG CHEST 2V
2 series · 2 of 2 positions shown · non-contrast
Comparison: 06/01/2011

CLINICAL DATA: Low back pain, shortness of breath.

CHEST - 2 VIEW

[t chest supine]
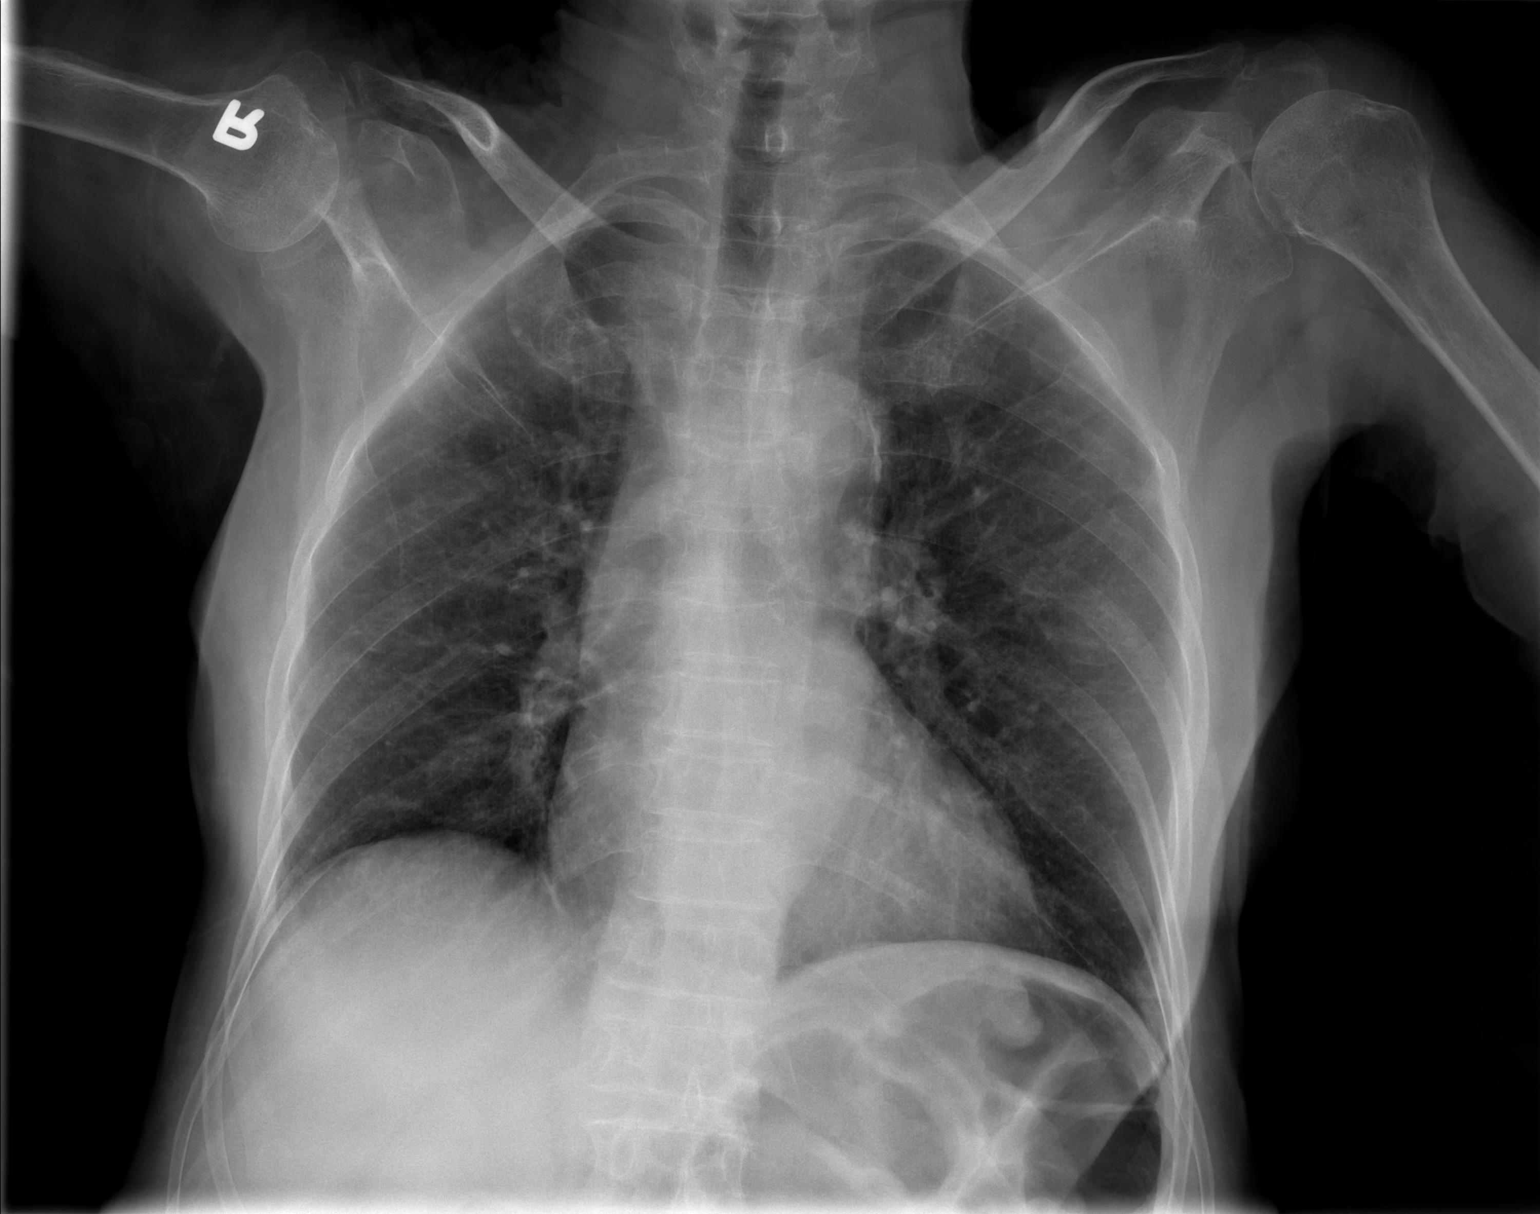

[w chest lat]
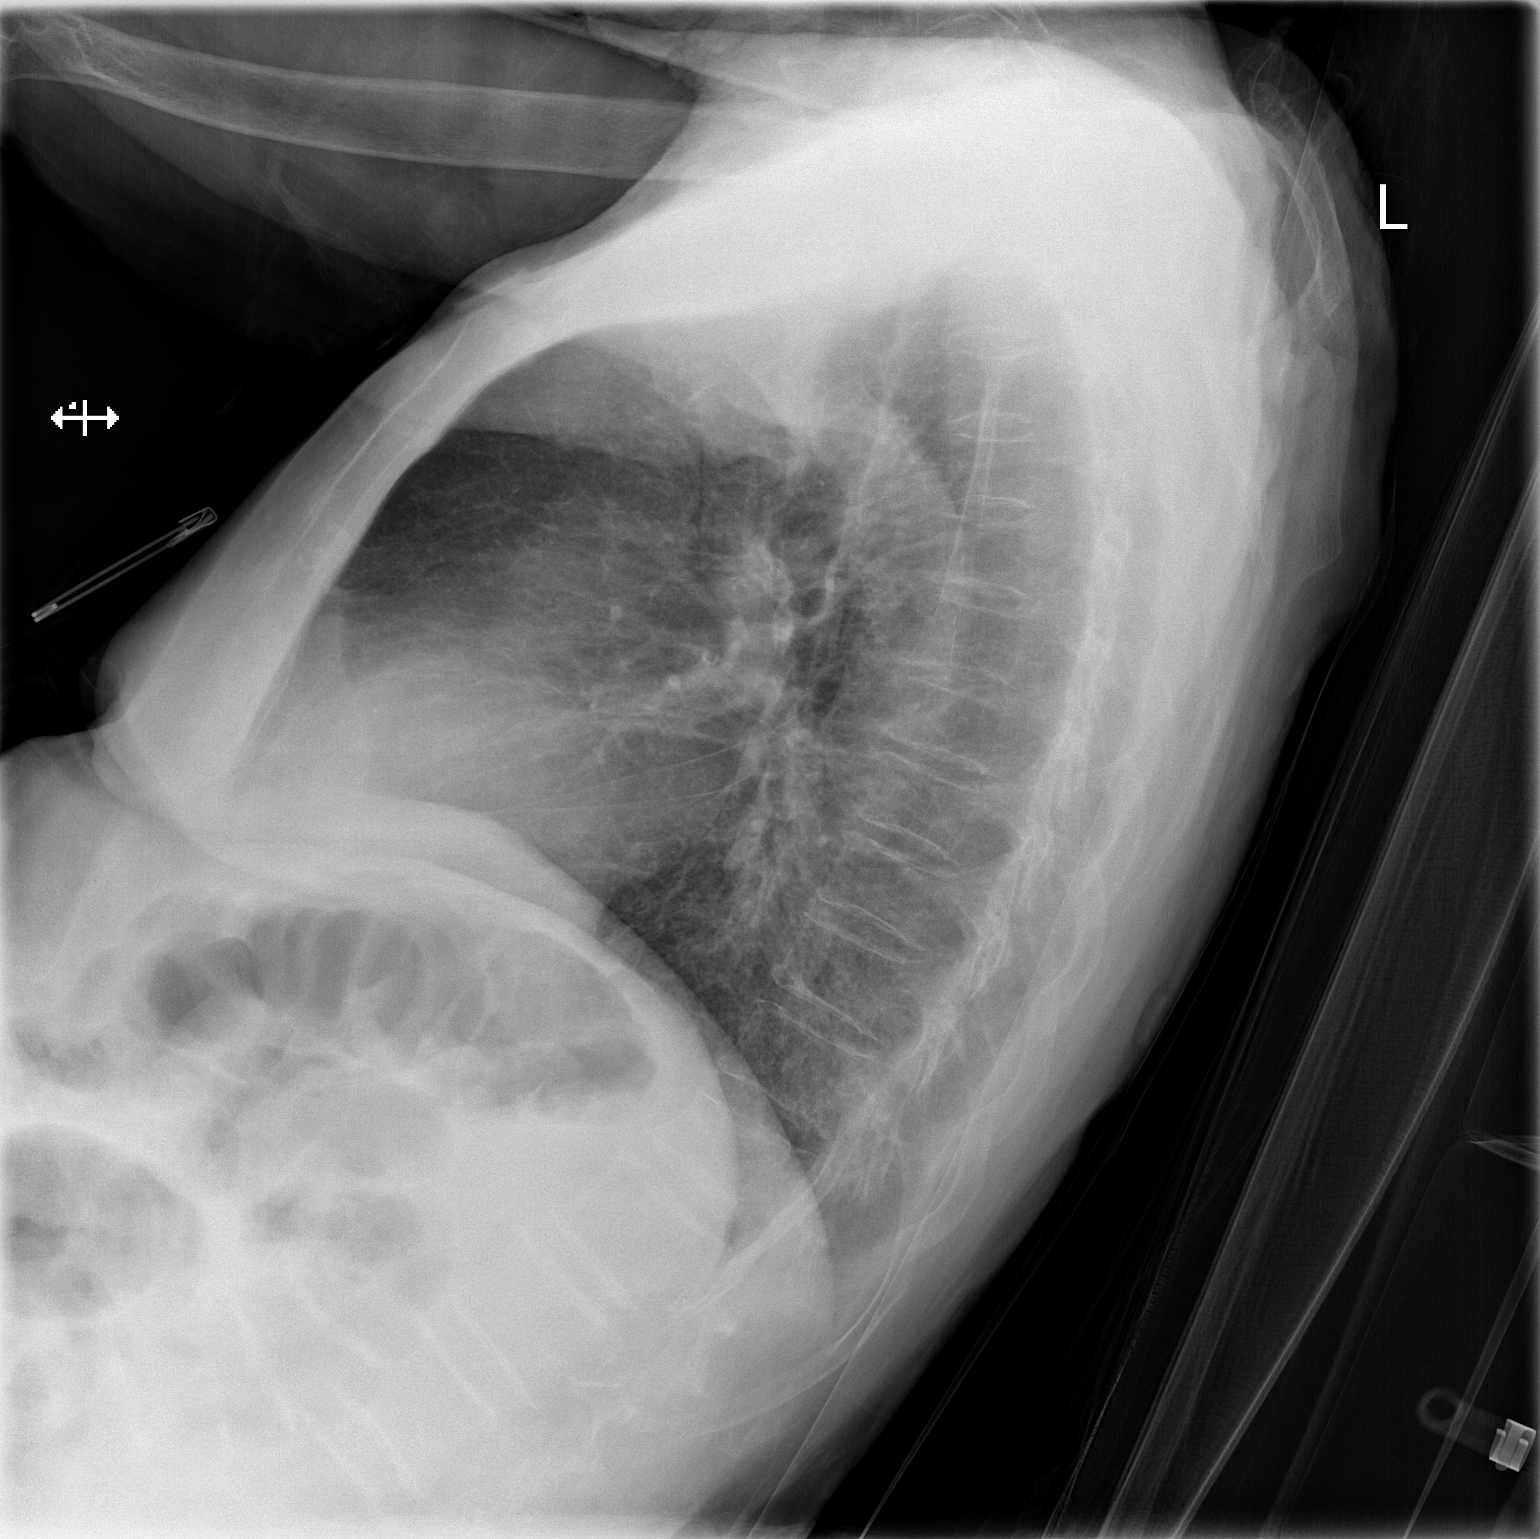

[2 of 2 positions shown; findings below may reference images not displayed]

FINDINGS: Heart is upper limits normal in size.  No confluent
airspace opacities or effusions.  No acute bony abnormality.
IMPRESSION: No active cardiopulmonary disease.

## 2014-04-02 SURGERY — ERCP, WITH INTERVENTION IF INDICATED
Anesthesia: General

## 2014-04-02 MED ORDER — GLUCAGON HCL RDNA (DIAGNOSTIC) 1 MG IJ SOLR
INTRAMUSCULAR | Status: DC | PRN
  Administered 2014-04-02 (×2): 0.25 mg via INTRAVENOUS

## 2014-04-02 MED ORDER — LABETALOL HCL 200 MG PO TABS
200.0000 mg | ORAL_TABLET | Freq: Two times a day (BID) | ORAL | Status: DC
  Administered 2014-04-02 – 2014-04-03 (×2): 200 mg via ORAL
  Filled 2014-04-02 (×3): qty 1

## 2014-04-02 MED ORDER — FENTANYL CITRATE 0.05 MG/ML IJ SOLN: 25.0000 ug | INTRAMUSCULAR | Status: DC | PRN

## 2014-04-02 MED ORDER — LIDOCAINE HCL (CARDIAC) 20 MG/ML IV SOLN
INTRAVENOUS | Status: DC | PRN
  Administered 2014-04-02: 60 mg via INTRAVENOUS

## 2014-04-02 MED ORDER — FENTANYL CITRATE 0.05 MG/ML IJ SOLN
INTRAMUSCULAR | Status: DC | PRN
  Administered 2014-04-02: 50 ug via INTRAVENOUS

## 2014-04-02 MED ORDER — GLUCAGON HCL RDNA (DIAGNOSTIC) 1 MG IJ SOLR
INTRAMUSCULAR | Status: AC
  Filled 2014-04-02: qty 2

## 2014-04-02 MED ORDER — LACTATED RINGERS IV SOLN
INTRAVENOUS | Status: DC
  Administered 2014-04-02: 1000 mL via INTRAVENOUS

## 2014-04-02 MED ORDER — PROPOFOL 10 MG/ML IV BOLUS
INTRAVENOUS | Status: DC | PRN
  Administered 2014-04-02: 100 mg via INTRAVENOUS

## 2014-04-02 MED ORDER — INFLUENZA VAC SPLIT QUAD 0.5 ML IM SUSY
0.5000 mL | PREFILLED_SYRINGE | INTRAMUSCULAR | Status: AC
  Administered 2014-04-03: 0.5 mL via INTRAMUSCULAR
  Filled 2014-04-02: qty 0.5

## 2014-04-02 MED ORDER — ISOSORB DINITRATE-HYDRALAZINE 20-37.5 MG PO TABS
1.0000 | ORAL_TABLET | Freq: Three times a day (TID) | ORAL | Status: DC
  Administered 2014-04-02 – 2014-04-03 (×3): 1 via ORAL
  Filled 2014-04-02 (×5): qty 1

## 2014-04-02 MED ORDER — LACTATED RINGERS IV SOLN
INTRAVENOUS | Status: DC | PRN
  Administered 2014-04-02: 11:00:00 via INTRAVENOUS

## 2014-04-02 MED ORDER — SODIUM CHLORIDE 0.9 % IV SOLN
1.5000 g | Freq: Once | INTRAVENOUS | Status: AC
  Administered 2014-04-02: 1.5 g via INTRAVENOUS
  Filled 2014-04-02 (×2): qty 1.5

## 2014-04-02 MED ORDER — SUCCINYLCHOLINE CHLORIDE 20 MG/ML IJ SOLN
INTRAMUSCULAR | Status: DC | PRN
  Administered 2014-04-02: 100 mg via INTRAVENOUS

## 2014-04-02 MED ORDER — EPHEDRINE SULFATE 50 MG/ML IJ SOLN
INTRAMUSCULAR | Status: DC | PRN
  Administered 2014-04-02 (×3): 10 mg via INTRAVENOUS

## 2014-04-02 MED ORDER — INDOMETHACIN 50 MG RE SUPP
100.0000 mg | Freq: Once | RECTAL | Status: AC
  Administered 2014-04-02: 100 mg via RECTAL
  Filled 2014-04-02: qty 2

## 2014-04-02 MED ORDER — SODIUM CHLORIDE 0.9 % IV SOLN
INTRAVENOUS | Status: DC
  Administered 2014-04-02: 06:00:00 via INTRAVENOUS

## 2014-04-02 NOTE — Progress Notes (Signed)
Day of Surgery  Subjective: No complaints  Objective: Vital signs in last 24 hours: Temp:  [97.9 F (36.6 C)-98.4 F (36.9 C)] 98.1 F (36.7 C) (10/09 1006) Pulse Rate:  [60-71] 71 (10/09 1221) Resp:  [15-19] 15 (10/09 1221) BP: (92-247)/(57-84) 247/84 mmHg (10/09 1221) SpO2:  [96 %-100 %] 100 % (10/09 1221) Weight:  [90 lb 6.4 oz (41.005 kg)] 90 lb 6.4 oz (41.005 kg) (10/09 0427) Last BM Date: 03/31/14  Intake/Output from previous day: 10/08 0701 - 10/09 0700 In: 360 [P.O.:360] Out: 380 [Urine:380] Intake/Output this shift: Total I/O In: 800 [I.V.:800] Out: -   Resp: clear to auscultation bilaterally Cardio: regular rate and rhythm GI: soft, nontender  Lab Results:   Recent Labs  04/01/14 0310 04/02/14 0325  WBC 10.1 4.3  HGB 11.7* 9.9*  HCT 36.5 31.5*  PLT 213 179   BMET  Recent Labs  04/01/14 0310 04/02/14 0325  NA 137 139  K 4.2 4.2  CL 98 109  CO2 24 22  GLUCOSE 265* 109*  BUN 26* 16  CREATININE 1.70* 1.30*  CALCIUM 9.8 8.5   PT/INR No results found for this basename: LABPROT, INR,  in the last 72 hours ABG No results found for this basename: PHART, PCO2, PO2, HCO3,  in the last 72 hours  Studies/Results: US Abdomen Complete  04/01/2014   CLINICAL DATA:  Acute epigastric pain.  EXAM: ULTRASOUND ABDOMEN COMPLETE  COMPARISON:  None.  FINDINGS: Gallbladder: Multiple gallstones are noted with associated sludge. Severe gallbladder wall thickening is noted measuring 6 mm. No pericholecystic fluid is noted. No sonographic Murphy's sign is noted.  Common bile duct: Diameter: 3.4 mm which is within normal limits.  Liver: Heterogeneous echotexture of parenchyma is noted suggesting diffuse hepatocellular disease.  IVC: No abnormality visualized.  Pancreas: Visualized portion unremarkable.  Spleen: Not visualized due to overlying bowel gas.  Right Kidney: Length: 9.4 cm. 2.7 cm cyst seen in lower pole. Echogenicity within normal limits. No mass or  hydronephrosis visualized.  Left Kidney:Not visualized due to overlying bowel gas.  Abdominal aorta: No aneurysm visualized.  Other findings: None.  IMPRESSION: Heterogeneous echotexture hepatic parenchyma is noted consistent with diffuse hepatocellular disease.  Cholelithiasis is noted with associated sludge and significant gallbladder wall thickening. This is concerning for acute cholecystitis. HIDA scan may be performed for further evaluation. These results will be called to the ordering clinician or representative by the Radiologist Assistant, and communication documented in the PACS or zVision Dashboard.   Electronically Signed   By: Sabino Dick M.D.   On: 04/01/2014 15:53    Anti-infectives: Anti-infectives   Start     Dose/Rate Route Frequency Ordered Stop   04/02/14 0415  ampicillin-sulbactam (UNASYN) 1.5 g in sodium chloride 0.9 % 50 mL IVPB     1.5 g 100 mL/hr over 30 Minutes Intravenous  Once 04/02/14 0401 04/02/14 1045      Assessment/Plan: s/p Procedure(s) with comments: ENDOSCOPIC RETROGRADE CHOLANGIOPANCREATOGRAPHY (ERCP) (N/A) LITHOTRIPSY (N/A) -   GASTROINTESTINAL STENT REMOVAL (N/A) Plan for HIDA scan to rule out cholecystitis. If positive then plan for perc drain. If neg then continue abx. Will follow  LOS: 1 day    TOTH III,PAUL S 04/02/2014

## 2014-04-02 NOTE — Op Note (Addendum)
[facility] [place], [address]   ERCP PROCEDURE REPORT        EXAM DATE: 04/02/2014  PATIENT NAME:          Tammy Willis, Tammy Willis          MR #: [id number] BIRTHDATE:       1925-03-02     VISIT #:     [id number] ATTENDING:     Ladene Artist, MD, Marval Regal     STATUS:     outpatient  ASSISTANT:      Cletis Athens and Mariana Single  INDICATIONS:  The patient is a 80 yr old female here for an ERCP due to established bile duct stone(s). PROCEDURE PERFORMED:     ERCP with removal of calculus/calculi  MEDICATIONS:     Per Anesthesia  CONSENT: The patient understands the risks and benefits of the procedure and understands that these risks include, but are not limited to: sedation, allergic reaction, infection, perforation and/or bleeding. Alternative means of evaluation and treatment include, among others: physical exam, x-rays, and/or surgical intervention. The patient elects to proceed with this endoscopic procedure.  DESCRIPTION OF PROCEDURE: During intra-op preparation period all mechanical & medical equipment was checked for proper function. Hand hygiene and appropriate measures for infection prevention was taken. After the risks, benefits and alternatives of the procedure were thoroughly explained, Informed was verified, confirmed and timeout was successfully executed by the treatment team. With the patient in left semi-prone position, medications were administered intravenously.The Pentax Ercp Scope [id number] was passed from the mouth into the esophagus and further advanced from the esophagus into the stomach. From stomach scope was directed to the third portion of the duodenum.  Major papilla was aligned with the duodenoscope. The scope position was confirmed fluoroscopically. Rest of the findings/therapeutics are given below. The scope was then completely withdrawn from the patient and the procedure completed. The pulse, BP,  and O2 saturation were monitored and documented by the physician and the nursing staff throughout the entire procedure. The patient was cared for as planned according to standard protocol. The patient was then discharged to recovery in stable condition and with appropriate post procedure care.  There was a medium sized periampullary diverticulum, a prior sphincterotomy and a previously placed biliary stent.   A single stone, 3 mm in size, was seen in the mid common bile duct. Mildly dilated CBD and normal appearing intrahepatic ducts. The cystic duct was partially filled. Using a stone extraction balloon the bile duct was swept three times.  A single stone was removed from the bile duct successfully.   The old stent was removed using a snare.  Excellent biliary drainage noted. The pancreatic duct was not cannulated or filled by intention.    ADVERSE EVENT:     There were no complications.  IMPRESSIONS:     1.  Medium periampullary diverticulum, prior sphincterotomy 2.  Single stone in the mid common bile duct; removed from the bile duct using balloon extraction 3.  The old biliary stent was removed using a snare   RECOMMENDATIONS:     Li 1. Monitor liver enzymes 2. Mgmt of cholelithiasis and chronic cholecystitis per General Surgery     Ladene Artist, MD, [facility] eSigned:  Ladene Artist, MD, [facility] 04/02/2014 12:11 PM Revised: 04/02/2014 12:11 PM

## 2014-04-02 NOTE — Interval H&P Note (Signed)
History and Physical Interval Note:  04/02/2014 11:12 AM  Tammy Willis  has presented today for surgery, with the diagnosis of choledocholithiasis, previous biliary stent placed  The various methods of treatment have been discussed with the patient and family. After consideration of risks, benefits and other options for treatment, the patient has consented to  Procedure(s) with comments: ENDOSCOPIC RETROGRADE CHOLANGIOPANCREATOGRAPHY (ERCP) (N/A) LITHOTRIPSY (N/A) -   GASTROINTESTINAL STENT REMOVAL (N/A) as a surgical intervention .  The patient's history has been reviewed, patient examined, no change in status, stable for surgery.  I have reviewed the patient's chart and labs.  Questions were answered to the patient's satisfaction.     Pricilla Riffle. Fuller Plan MD

## 2014-04-02 NOTE — Progress Notes (Signed)
Dr. Dyann Kief, notified of elevated bp's today in 200'systolic, now 736/68 after prn hydralazine. Patient denies discomfort. Family at bedside Joylene Draft A

## 2014-04-02 NOTE — Progress Notes (Addendum)
[facility] PROGRESS NOTE  Tammy Willis TIW:[id number] DOB: June 26, 1924 DOA: 04/01/2014 PCP: Chari Manning, NP  Assessment/Plan: 1-accelerated HTN: better. -continue bidil and add labetalol   2-abd pain, nausea and vomiting: appears to be secondary to choledocholithiasis and stone retention  -s/p ERCP with stone removal and removal of stent -will follow GI rec's  3-diabetes: continue SSI for now  4-CKD stage 3: stable. -avoid nephrotoxic agents -follow renal function  5-protein calorie malnutrition: severe -diet to be advance slowly -will follow nutrition service rec's  Code Status: Full Family Communication: daughter at bedside Disposition Plan: home when medically stable   Consultants:  GI  Surgery   Procedures:  ERCP (10/9)  Antibiotics:  Unasyn 10/9   HPI/Subjective: Denies abd pain, nausea, vomiting, no fever.  Objective: Filed Vitals:   04/02/14 1329  BP: 225/83  Pulse: 56  Temp:   Resp:     Intake/Output Summary (Last 24 hours) at 04/02/14 1502 Last data filed at 04/02/14 1250  Gross per 24 hour  Intake   1460 ml  Output    230 ml  Net   1230 ml   Filed Weights   04/01/14 0609 04/02/14 0427  Weight: 41.096 kg (90 lb 9.6 oz) 41.005 kg (90 lb 6.4 oz)    Exam:   General:  Alert, awake and oriented X 3; no fever; denies abd pain, nausea or vomiting today  Cardiovascular: S1 and S2, no rubs or gallops  Respiratory: CTA bilaterally  Abdomen: soft, positive BS, mild discomfort RUQ with deep firm palpation; no guarding  Musculoskeletal: no edema, no cyanosis   Data Reviewed: Basic Metabolic Panel:  Recent Labs Lab 03/29/14 1112 04/01/14 0310 04/02/14 0325  NA 141 137 139  K 4.5 4.2 4.2  CL 104 98 109  CO2 26 24 22   GLUCOSE 147* 265* 109*  BUN 19 26* 16  CREATININE 1.20* 1.70* 1.30*  CALCIUM 9.5 9.8 8.5   Liver Function Tests:  Recent Labs Lab 03/29/14 1112 04/01/14 0310 04/02/14 0325  AST 18 17 14    ALT 7 6 5   ALKPHOS 160* 165* 105  BILITOT 0.5 0.4 0.4  PROT 7.7 7.8 5.7*  ALBUMIN 3.3* 3.3* 2.3*    Recent Labs Lab 03/29/14 1112 04/01/14 0310  LIPASE 21 38   CBC:  Recent Labs Lab 03/29/14 1112 04/01/14 0310 04/02/14 0325  WBC 5.5 10.1 4.3  NEUTROABS 4.3 9.4*  --   HGB 12.0 11.7* 9.9*  HCT 37.9 36.5 31.5*  MCV 90.9 92.6 91.8  PLT 232 213 179   Cardiac Enzymes:  Recent Labs Lab 03/29/14 1112  TROPONINI <0.30   CBG:  Recent Labs Lab 04/01/14 2109 04/01/14 2349 04/02/14 0423 04/02/14 0756 04/02/14 1357  GLUCAP 84 122* 116* 118* 181*    Studies: US Abdomen Complete  04/01/2014   CLINICAL DATA:  Acute epigastric pain.  EXAM: ULTRASOUND ABDOMEN COMPLETE  COMPARISON:  None.  FINDINGS: Gallbladder: Multiple gallstones are noted with associated sludge. Severe gallbladder wall thickening is noted measuring 6 mm. No pericholecystic fluid is noted. No sonographic Murphy's sign is noted.  Common bile duct: Diameter: 3.4 mm which is within normal limits.  Liver: Heterogeneous echotexture of parenchyma is noted suggesting diffuse hepatocellular disease.  IVC: No abnormality visualized.  Pancreas: Visualized portion unremarkable.  Spleen: Not visualized due to overlying bowel gas.  Right Kidney: Length: 9.4 cm. 2.7 cm cyst seen in lower pole. Echogenicity within normal limits. No mass or hydronephrosis visualized.  Left Kidney:Not visualized due to overlying bowel  gas.  Abdominal aorta: No aneurysm visualized.  Other findings: None.  IMPRESSION: Heterogeneous echotexture hepatic parenchyma is noted consistent with diffuse hepatocellular disease.  Cholelithiasis is noted with associated sludge and significant gallbladder wall thickening. This is concerning for acute cholecystitis. HIDA scan may be performed for further evaluation. These results will be called to the ordering clinician or representative by the Radiologist Assistant, and communication documented in the PACS or  zVision Dashboard.   Electronically Signed   By: Sabino Dick M.D.   On: 04/01/2014 15:53   Dg Ercp  04/02/2014   CLINICAL DATA:  Stent removal  EXAM: ERCP  TECHNIQUE: Multiple spot images obtained with the fluoroscopic device and submitted for interpretation post-procedure.  COMPARISON:  None.  FINDINGS: Images demonstrate removal of the temporary biliary stent. The common bile duct is dilated. Balloon stone removal is documented.  IMPRESSION: See above.  These images were submitted for radiologic interpretation only. Please see the procedural report for the amount of contrast and the fluoroscopy time utilized.   Electronically Signed   By: Maryclare Bean M.D.   On: 04/02/2014 12:35    Scheduled Meds: . [START ON 04/03/2014] Influenza vac split quadrivalent PF  0.5 mL Intramuscular Tomorrow-1000  . insulin aspart  0-9 Units Subcutaneous 6 times per day  . isosorbide-hydrALAZINE  1 tablet Oral TID  . metoprolol  5 mg Intravenous 4 times per day  . pantoprazole (PROTONIX) IV  40 mg Intravenous Q24H   Continuous Infusions: . sodium chloride 125 mL/hr at 04/02/14 0112  . sodium chloride 20 mL/hr at 04/02/14 0534  . lactated ringers 1,000 mL (04/02/14 1016)    Principal Problem:   Nausea with vomiting Active Problems:   DM (diabetes mellitus), type 2, uncontrolled, with renal complications   Accelerated hypertension   Choledocholithiasis with obstruction   Abdominal pain   Vomiting   Abdominal pain, epigastric   CKD (chronic kidney disease), stage III    Time spent: < 30 minutes    Tammy Willis  [facility] Pager [phone]. If 7PM-7AM, please contact night-coverage at [email or web address], password [facility] 04/02/2014, 3:02 PM  LOS: 1 day

## 2014-04-02 NOTE — Transfer of Care (Signed)
Immediate Anesthesia Transfer of Care Note  Patient: Fatime Ramirez-Sarabia  Procedure(s) Performed: Procedure(s) with comments: ENDOSCOPIC RETROGRADE CHOLANGIOPANCREATOGRAPHY (ERCP) (N/A) LITHOTRIPSY (N/A) -   GASTROINTESTINAL STENT REMOVAL (N/A)  Patient Location: PACU  Anesthesia Type:General  Level of Consciousness: awake, alert  and oriented  Airway & Oxygen Therapy: Patient Spontanous Breathing  Post-op Assessment: Report given to PACU RN  Post vital signs: Reviewed and stable  Complications: No apparent anesthesia complications

## 2014-04-02 NOTE — Anesthesia Postprocedure Evaluation (Signed)
  Anesthesia Post-op Note  Patient: Tammy Willis  Procedure(s) Performed: Procedure(s) with comments: ENDOSCOPIC RETROGRADE CHOLANGIOPANCREATOGRAPHY (ERCP) (N/A) LITHOTRIPSY (N/A) -   GASTROINTESTINAL STENT REMOVAL (N/A)  Patient Location: PACU  Anesthesia Type:General  Level of Consciousness: awake, alert  and oriented  Airway and Oxygen Therapy: Patient Spontanous Breathing and Patient connected to face mask oxygen  Post-op Pain: none  Post-op Assessment: Post-op Vital signs reviewed, Patient's Cardiovascular Status Stable, Respiratory Function Stable and Patent Airway  Post-op Vital Signs: Reviewed and stable  Last Vitals:  Filed Vitals:   04/02/14 1140  BP: 92/57  Pulse:   Temp:   Resp:     Complications: No apparent anesthesia complications

## 2014-04-02 NOTE — Anesthesia Preprocedure Evaluation (Addendum)
Anesthesia Evaluation  Patient identified by MRN, date of birth, ID band Patient awake    Reviewed: Allergy & Precautions, H&P , NPO status , Patient's Chart, lab work & pertinent test results  History of Anesthesia Complications (+) history of anesthetic complications  Airway Mallampati: II TM Distance: >3 FB Neck ROM: Full    Dental  (+) Poor Dentition, Dental Advisory Given   Pulmonary neg pulmonary ROS,    Pulmonary exam normal       Cardiovascular hypertension, Pt. on medications     Neuro/Psych negative neurological ROS  negative psych ROS   GI/Hepatic Neg liver ROS, hiatal hernia,   Endo/Other  diabetes  Renal/GU Renal InsufficiencyRenal disease     Musculoskeletal   Abdominal   Peds  Hematology   Anesthesia Other Findings   Reproductive/Obstetrics                          Anesthesia Physical Anesthesia Plan  ASA: III  Anesthesia Plan: General   Post-op Pain Management:    Induction: Intravenous  Airway Management Planned: Oral ETT  Additional Equipment:   Intra-op Plan:   Post-operative Plan: Extubation in OR  Informed Consent: I have reviewed the patients History and Physical, chart, labs and discussed the procedure including the risks, benefits and alternatives for the proposed anesthesia with the patient or authorized representative who has indicated his/her understanding and acceptance.   Dental advisory given  Plan Discussed with: CRNA, Anesthesiologist and Surgeon  Anesthesia Plan Comments:        Anesthesia Quick Evaluation

## 2014-04-02 NOTE — H&P (View-Only) (Signed)
[facility] Consult: 11:01 AM 04/01/2014  LOS: 0 days    Referring Provider: Dr C. Conley Canal, [facility]  Primary Care Physician:  Chari Manning, NP Primary Gastroenterologist:  Dr. Fuller Plan    Reason for Consultation:  Nausea, vomiting and abdominal pain.    HPI: Tammy Willis is a 80 y.o. female.  Frail, aged, Spanish only speaking pt.  Diabetic, on no meds.  Hx choledocholithiasias, chronic cholecystitis, cholelithiasis treated with ERCP/sphinct/stone extraction/biliary stent 01/06/14. Periampullary diverticulum noted.  There was large stone retained that Dr Fuller Plan was unable to remove. Surgery felt pt was too frail for cholecystectomy.  Imaging also revealed a new dx of cirrhosis of undetermined etiology.  Set up for repeat ERCP, stent removal, possible lithotripsy on 10/5 but BP 260/120 prevented MD from pursuing the ERCP.  Sent to ED and treated with hydralazine.   Pt developed epigastric abdominal pain and vomiting yesterday.  sxs similar to those of 12/2013.   Admitted from ED.  ALK phos in 160s, otherwise LFTs and lipase normal.  Glucose in 260s.  WBCs normal.   BP 160s-180s/ 70s.  No CT, ultrasound or xrays thus far.     Past Medical History  Diagnosis Date  . Hypertension   . Diabetes mellitus   . Choledocholithiasis with obstruction 01/12/2014  . Hiatal hernia   . Common bile duct stone 12/2013  . CKD (chronic kidney disease) 12/2013.     Past Surgical History  Procedure Laterality Date  . Kidney stone surgery    . Cataract extraction Bilateral   . Appendectomy    . Esophagogastroduodenoscopy N/A 01/13/2014    Procedure: ESOPHAGOGASTRODUODENOSCOPY (EGD);  Surgeon: Ladene Artist, MD;  Location: [facility] ENDOSCOPY;  Service: Endoscopy;  Laterality: N/A;  . Ercp N/A 01/18/2014    Procedure:  ENDOSCOPIC RETROGRADE CHOLANGIOPANCREATOGRAPHY (ERCP);  Surgeon: Ladene Artist, MD;  Location: [facility] ENDOSCOPY;  Service: Endoscopy;  Laterality: N/A;    Prior to Admission medications   Medication Sig Start Date End Date Taking? Authorizing Provider  amLODipine (NORVASC) 5 MG tablet Take 5 mg by mouth every morning.    Historical Provider, MD  hydrALAZINE (APRESOLINE) 50 MG tablet Take 0.5 tablets (25 mg total) by mouth 3 (three) times daily. 03/29/14   Jasper Riling. Alvino Chapel, MD  Multiple Vitamins-Minerals (MULTIVITAMINS THER. W/MINERALS) TABS Take 1 tablet by mouth daily. Centrum silver     Historical Provider, MD  polyethylene glycol (MIRALAX / GLYCOLAX) packet Take 17 g by mouth 2 (two) times daily.    Historical Provider, MD  ranitidine (ZANTAC) 300 MG tablet Take 300 mg by mouth at bedtime.    Historical Provider, MD    Scheduled Meds: . insulin aspart  0-9 Units Subcutaneous 6 times per day  . metoprolol  5 mg Intravenous 4 times per day  . pantoprazole (PROTONIX) IV  40 mg Intravenous Q24H   Infusions: . sodium chloride 125 mL/hr at 04/01/14 0446   PRN Meds: hydrALAZINE, morphine injection, ondansetron (ZOFRAN) IV, promethazine   Allergies as of 04/01/2014  . (No Known Allergies)  History reviewed. No pertinent family history.  History   Social History  . Marital Status: Single    Spouse Name: N/A    Number of Children: 67  . Years of Education: N/A   Occupational History  . Not on file.   Social History Main Topics  . Smoking status: Never Smoker   . Smokeless tobacco: Never Used  . Alcohol Use: No  . Drug Use: No  . Sexual Activity: No   Other Topics Concern  . Not on file   Social History Narrative  . No narrative on file    REVIEW OF SYSTEMS: Constitutional:  8 # weight loss since 12/2013.  Sleeping alot ENT:  No nose bleeds Pulm:  No cough or CV:  No palpitations, no LE edema.  GU:  No hematuria, no frequency GI:  Last emesis 0800 today Heme:  No  unusual bleeding or bruising   Transfusions:  none Neuro:  No headaches, no peripheral tingling or numbness Derm:  No itching, no rash or sores.  Endocrine:  No sweats or chills.  No polyuria or dysuria Immunization:  Not queried Travel:  None beyond local counties in last few months.    PHYSICAL EXAM: Vital signs in last 24 hours: Filed Vitals:   04/01/14 0609  BP: 164/74  Pulse: 85  Temp: 98.4 F (36.9 C)  Resp: 18   Wt Readings from Last 3 Encounters:  04/01/14 41.096 kg (90 lb 9.6 oz)  02/17/14 39.735 kg (87 lb 9.6 oz)  01/21/14 44.725 kg (98 lb 9.6 oz)   General: frail, cachectic hispanic aged female. Head:  No swelling or asymmetry  Eyes:  No icterus or pallor Ears:  Not HOH  Nose:  No discharge Mouth:  Clear, moist.  No lesions, few teeth Neck:  No JVD, no TMG or mass Lungs:  Clear bil but reduced overall.  No cough or dyspnea Heart: RRR.  No MRG Abdomen:  Soft, BS active.  Slight epigastric tenderness.  No mass, no HSM.   Rectal: deferred   Musc/Skeltl: no joint swelling.  + spinal kyphosis.  Extremities:  No pedal or LE edema  Neurologic:  Follows commands.  No tremor, no limb weakness Skin:  No jaundice.  No sores Tattoos:  none Nodes:  No cervical adenopathy.    Psych:  Relaxed.   Intake/Output from previous day:   Intake/Output this shift:    LAB RESULTS:  Recent Labs  03/29/14 1112 04/01/14 0310  WBC 5.5 10.1  HGB 12.0 11.7*  HCT 37.9 36.5  PLT 232 213   BMET Lab Results  Component Value Date   NA 137 04/01/2014   NA 141 03/29/2014   NA 139 02/17/2014   K 4.2 04/01/2014   K 4.5 03/29/2014   K 4.3 02/17/2014   CL 98 04/01/2014   CL 104 03/29/2014   CL 105 02/17/2014   CO2 24 04/01/2014   CO2 26 03/29/2014   CO2 27 02/17/2014   GLUCOSE 265* 04/01/2014   GLUCOSE 147* 03/29/2014   GLUCOSE 141* 02/17/2014   BUN 26* 04/01/2014   BUN 19 03/29/2014   BUN 12 02/17/2014   CREATININE 1.70* 04/01/2014   CREATININE 1.20* 03/29/2014   CREATININE 1.2  02/17/2014   CALCIUM 9.8 04/01/2014   CALCIUM 9.5 03/29/2014   CALCIUM 9.5 02/17/2014   LFT  Recent Labs  03/29/14 1112 04/01/14 0310  PROT 7.7 7.8  ALBUMIN 3.3* 3.3*  AST 18 17  ALT 7 6  ALKPHOS 160* 165*  BILITOT 0.5 0.4   PT/INR Lab Results  Component Value Date   INR 0.99 01/14/2014   INR 1.15 01/12/2014   Lipase     Component Value Date/Time   LIPASE 38 04/01/2014 0310    RADIOLOGY STUDIES: No results found.  ENDOSCOPIC STUDIES: ERCP 12/2013 per HPI  IMPRESSION:   *  Symptomatic gallstones and bile duct stones.  Treated with ERCP/sphinct/stone extraction/stenting 12/2013.  Known retained large CBD stone.    *  Hypertension.  Forced cancellation of 10/5 repeat ERCP.   Now improved after starting hydralazine.   *  DM.    *  CKD.      PLAN:     *  Set her up for ERCP with MAC, at 1045 tomorrow.  Will d/w Dr Fuller Plan if he wants any imaging performed before ERCP.     Azucena Freed  04/01/2014, 11:01 AM Pager: [phone]       Attending physician's note   I have taken a history, examined the patient and reviewed the chart. I agree with the Advanced Practitioner's note, impression and recommendations. Choledocholithiasis with a large CBD stone remaining that could not be removed at prior ERCP and a bilary stent was placed. It does not appear that choledocholithiasis is causing her current symptoms as her LFTs are stable. Her current symptoms could be due to cholelithiasis/chronic cholecystitis-please consult General Surgery for mgmt of this problem. Abd [place] today. BP now under better control. ERCP tomorrow with attempt at stone extraction and/or lithotripsy for her large CBD stone.   Ladene Artist, MD [facility]

## 2014-04-03 DIAGNOSIS — R1013 Epigastric pain: Secondary | ICD-10-CM

## 2014-04-03 DIAGNOSIS — K8051 Calculus of bile duct without cholangitis or cholecystitis with obstruction: Secondary | ICD-10-CM

## 2014-04-03 LAB — GLUCOSE, CAPILLARY
GLUCOSE-CAPILLARY: 105 mg/dL — AB (ref 70–99)
Glucose-Capillary: 126 mg/dL — ABNORMAL HIGH (ref 70–99)
Glucose-Capillary: 87 mg/dL (ref 70–99)
Glucose-Capillary: 94 mg/dL (ref 70–99)

## 2014-04-03 MED ORDER — PANTOPRAZOLE SODIUM 40 MG PO TBEC
40.0000 mg | DELAYED_RELEASE_TABLET | Freq: Every day | ORAL | Status: DC
  Administered 2014-04-03: 40 mg via ORAL
  Filled 2014-04-03: qty 1

## 2014-04-03 MED ORDER — ACETAMINOPHEN 325 MG PO TABS: 650.0000 mg | ORAL_TABLET | Freq: Four times a day (QID) | ORAL | Status: DC | PRN

## 2014-04-03 MED ORDER — PANTOPRAZOLE SODIUM 40 MG PO TBEC: 40.0000 mg | DELAYED_RELEASE_TABLET | Freq: Every day | ORAL | Status: AC

## 2014-04-03 MED ORDER — INSULIN ASPART 100 UNIT/ML ~~LOC~~ SOLN: 0.0000 [IU] | Freq: Three times a day (TID) | SUBCUTANEOUS | Status: DC

## 2014-04-03 MED ORDER — LABETALOL HCL 200 MG PO TABS: 200.0000 mg | ORAL_TABLET | Freq: Two times a day (BID) | ORAL | Status: AC

## 2014-04-03 MED ORDER — HYDRALAZINE HCL 50 MG PO TABS: 50.0000 mg | ORAL_TABLET | Freq: Three times a day (TID) | ORAL | Status: AC

## 2014-04-03 NOTE — Progress Notes (Signed)
Noted orders to dc patient home.DC instruction to give to patient, patient daughter and Son in Sports coach. Daughter and Son in law verbalized understating of instructions given in English and acted as interpreter to the patient.

## 2014-04-03 NOTE — Discharge Summary (Signed)
Physician Discharge Summary  Tammy Willis UXL:[id number] DOB: 05-12-1925 DOA: 04/01/2014  PCP: Chari Manning, NP  Admit date: 04/01/2014 Discharge date: 04/03/2014  Time spent: >30 minutes  Recommendations for Outpatient Follow-up:  Reassess BP and adjust medications as needed Check CMP to follow electrolytes, LFT's and renal function  Discharge Diagnoses:  Principal Problem:   Nausea with vomiting Active Problems:   DM (diabetes mellitus), type 2, uncontrolled, with renal complications   Accelerated hypertension   Choledocholithiasis with obstruction   Abdominal pain   Vomiting   Abdominal pain, epigastric   CKD (chronic kidney disease), stage III   Discharge Condition: stable and improved. Will discharge home. Follow up with PCP in 1 week and with general surgery in 2-3 weeks.  Diet recommendation: low carbohydrates, low fat and low sodium diet  Filed Weights   04/01/14 0609 04/02/14 0427  Weight: 41.096 kg (90 lb 9.6 oz) 41.005 kg (90 lb 6.4 oz)    History of present illness:  80 y.o. female With history of choledocholithiasis, sphincterotomy stone extraction and biliary stent 7/15 by Dr. Fuller Plan presents with several days of vomiting and upper abdominal pain. Apparently, the largest stone could not be extracted and the plan was to repeat ERCP and consider lithotripsy. She was scheduled to have ERCP on 03/29/2014, but the procedure was reportedly canceled due to prohibitively high blood pressure. Patient now admitted for further evaluation and treatment.   Hospital Course:  1-accelerated HTN: improved and much better controlled at discharge  -regimen used include hydralazine 50mg  TID and labetalol 200mg  BID -reassess BP in outpatient setting and adjust/add further antihypertensive agents to her regimen. -advise to follow low sodium diet   2-abd pain, nausea and vomiting: appears to be secondary to choledocholithiasis and stone retention  -s/p ERCP with stone  removal and removal of stent  -patient now asymptomatic -plan is for patient to go home and to follow with general surgery as an outpatient; at that time further decision will be made for long term treatment of presumed underlying chronic cholecystitis -patient afebrile, LFT's WNL, WBC's WNL and Hida Scan 2 months ago was normal -given how frail she is will advocate for conservative management as much as possible  3-diabetes: SSI used during hospitalization -continue diet controlled -most recent A1C 6.0  4-CKD stage 3: overall stable base on GFR. Slight increase Cr level on admission, most likely due to dehydration.  -avoid nephrotoxic agents  -follow renal function as an outpatient -patient advised to keep herself well hydrated  -Cr 1.3 at discharge (down from 1.7)  5-protein calorie malnutrition: severe  -diet to be advance slowly to low fat diet -patient instructed to use glucerna at least BID to supplement nutrition   Procedures: ERCP (with stone and previous stent removal)  Consultations:  GI  General surgery   Discharge Exam: Filed Vitals:   04/03/14 0500  BP: 143/67  Pulse: 63  Temp: 98.4 F (36.9 C)  Resp: 16   General: Alert, awake and oriented X 3; no fever; denies abd pain, nausea or vomiting today. Appetite is good and has been able to tolerate full liquid diet Cardiovascular: S1 and S2, no rubs or gallops  Respiratory: CTA bilaterally  Abdomen: soft, positive BS, no tenderness, no guarding  Musculoskeletal: no edema, no cyanosis    Discharge Instructions You were cared for by a hospitalist during your hospital stay. If you have any questions about your discharge medications or the care you received while you were in the hospital after you  are discharged, you can call the unit and asked to speak with the hospitalist on call if the hospitalist that took care of you is not available. Once you are discharged, your primary care physician will handle any further  medical issues. Please note that NO REFILLS for any discharge medications will be authorized once you are discharged, as it is imperative that you return to your primary care physician (or establish a relationship with a primary care physician if you do not have one) for your aftercare needs so that they can reassess your need for medications and monitor your lab values.  Discharge Instructions   Diet - low sodium heart healthy    Complete by:  As directed      Discharge instructions    Complete by:  As directed   Take medications as prescribed Follow a low sodium, low fat and low carbohydrates diet Maintain yourself well hydrated Arrange follow up with PCP in 1 week Follow up with Dr. Rosendo Gros (general surgery) in 2-3 weeks          Current Discharge Medication List    START taking these medications   Details  labetalol (NORMODYNE) 200 MG tablet Take 1 tablet (200 mg total) by mouth 2 (two) times daily. Qty: 60 tablet, Refills: 1    pantoprazole (PROTONIX) 40 MG tablet Take 1 tablet (40 mg total) by mouth daily at 12 noon. Qty: 30 tablet, Refills: 1      CONTINUE these medications which have CHANGED   Details  hydrALAZINE (APRESOLINE) 50 MG tablet Take 1 tablet (50 mg total) by mouth 3 (three) times daily. Qty: 90 tablet, Refills: 1      CONTINUE these medications which have NOT CHANGED   Details  polyethylene glycol (MIRALAX / GLYCOLAX) packet Take 17 g by mouth daily as needed.       No Known Allergies Follow-up Information   Follow up with Chari Manning, NP. Schedule an appointment as soon as possible for a visit in 1 week.   Specialty:  Internal Medicine   Contact information:   [place] [address] [phone]       Call Reyes Ivan, MD. (office to set up follow up appointment in 2-3 weeks)    Specialty:  General Surgery   Contact information:   [address] [place] [address] [phone]       The results of  significant diagnostics from this hospitalization (including imaging, microbiology, ancillary and laboratory) are listed below for reference.    Significant Diagnostic Studies: Dg Chest 2 View  03/29/2014   CLINICAL DATA:  Hypertension. Choledocholithiasis. Possible chronic cholecystitis.  EXAM: CHEST  2 VIEW  COMPARISON:  01/11/2014.  FINDINGS: Mediastinum and hilar structures are normal. Diffuse bilateral chronic interstitial prominence noted suggesting chronic interstitial lung disease. No pleural effusion or pneumothorax. Cardiomegaly, normal pulmonary vascularity. Diffuse osteopenia. Mild compression lower thoracic vertebral body is stable. Biliary stent noted in good anatomic position.  IMPRESSION: 1. Chronic interstitial lung disease. No acute abnormality. Stable cardiomegaly, no CHF. 2. Biliary stent noted in good anatomic position.   Electronically Signed   By: Marcello Moores  Register   On: 03/29/2014 11:50   US Abdomen Complete  04/01/2014   CLINICAL DATA:  Acute epigastric pain.  EXAM: ULTRASOUND ABDOMEN COMPLETE  COMPARISON:  None.  FINDINGS: Gallbladder: Multiple gallstones are noted with associated sludge. Severe gallbladder wall thickening is noted measuring 6 mm. No pericholecystic fluid is noted. No sonographic Murphy's sign is  noted.  Common bile duct: Diameter: 3.4 mm which is within normal limits.  Liver: Heterogeneous echotexture of parenchyma is noted suggesting diffuse hepatocellular disease.  IVC: No abnormality visualized.  Pancreas: Visualized portion unremarkable.  Spleen: Not visualized due to overlying bowel gas.  Right Kidney: Length: 9.4 cm. 2.7 cm cyst seen in lower pole. Echogenicity within normal limits. No mass or hydronephrosis visualized.  Left Kidney:Not visualized due to overlying bowel gas.  Abdominal aorta: No aneurysm visualized.  Other findings: None.  IMPRESSION: Heterogeneous echotexture hepatic parenchyma is noted consistent with diffuse hepatocellular disease.   Cholelithiasis is noted with associated sludge and significant gallbladder wall thickening. This is concerning for acute cholecystitis. HIDA scan may be performed for further evaluation. These results will be called to the ordering clinician or representative by the Radiologist Assistant, and communication documented in the PACS or zVision Dashboard.   Electronically Signed   By: Sabino Dick M.D.   On: 04/01/2014 15:53   Dg Ercp  04/02/2014   CLINICAL DATA:  Stent removal  EXAM: ERCP  TECHNIQUE: Multiple spot images obtained with the fluoroscopic device and submitted for interpretation post-procedure.  COMPARISON:  None.  FINDINGS: Images demonstrate removal of the temporary biliary stent. The common bile duct is dilated. Balloon stone removal is documented.  IMPRESSION: See above.  These images were submitted for radiologic interpretation only. Please see the procedural report for the amount of contrast and the fluoroscopy time utilized.   Electronically Signed   By: Maryclare Bean M.D.   On: 04/02/2014 12:35   Labs: Basic Metabolic Panel:  Recent Labs Lab 03/29/14 1112 04/01/14 0310 04/02/14 0325  NA 141 137 139  K 4.5 4.2 4.2  CL 104 98 109  CO2 26 24 22   GLUCOSE 147* 265* 109*  BUN 19 26* 16  CREATININE 1.20* 1.70* 1.30*  CALCIUM 9.5 9.8 8.5   Liver Function Tests:  Recent Labs Lab 03/29/14 1112 04/01/14 0310 04/02/14 0325  AST 18 17 14   ALT 7 6 5   ALKPHOS 160* 165* 105  BILITOT 0.5 0.4 0.4  PROT 7.7 7.8 5.7*  ALBUMIN 3.3* 3.3* 2.3*    Recent Labs Lab 03/29/14 1112 04/01/14 0310  LIPASE 21 38   CBC:  Recent Labs Lab 03/29/14 1112 04/01/14 0310 04/02/14 0325  WBC 5.5 10.1 4.3  NEUTROABS 4.3 9.4*  --   HGB 12.0 11.7* 9.9*  HCT 37.9 36.5 31.5*  MCV 90.9 92.6 91.8  PLT 232 213 179   Cardiac Enzymes:  Recent Labs Lab 03/29/14 1112  TROPONINI <0.30   CBG:  Recent Labs Lab 04/02/14 2014 04/03/14 0014 04/03/14 0530 04/03/14 0812 04/03/14 1146  GLUCAP  137* 126* 87 94 105*    Signed:  Barton Dubois  [facility] 04/03/2014, 1:14 PM

## 2014-04-03 NOTE — Progress Notes (Signed)
Progress Note   Subjective  No pain, N, V. No complaints   Objective   Vital signs in last 24 hours: Temp:  [97.5 F (36.4 C)-98.4 F (36.9 C)] 98.4 F (36.9 C) (10/10 0500) Pulse Rate:  [56-73] 63 (10/10 0500) Resp:  [14-21] 16 (10/10 0500) BP: (127-251)/(52-90) 143/67 mmHg (10/10 0500) SpO2:  [98 %-100 %] 99 % (10/10 0500) Last BM Date: 03/31/14 General:    Spanish female in NAD Heart:  Regular rate and rhythm Abdomen:  Soft, nontender and nondistended. Normal bowel sounds. Extremities:  Without edema. Neurologic:  Alert,  grossly normal neurologically. Psych:  Cooperative. Normal mood and affect.    Lab Results:  Recent Labs  04/01/14 0310 04/02/14 0325  WBC 10.1 4.3  HGB 11.7* 9.9*  HCT 36.5 31.5*  PLT 213 179   BMET  Recent Labs  04/01/14 0310 04/02/14 0325  NA 137 139  K 4.2 4.2  CL 98 109  CO2 24 22  GLUCOSE 265* 109*  BUN 26* 16  CREATININE 1.70* 1.30*  CALCIUM 9.8 8.5   LFT  Recent Labs  04/02/14 0325  PROT 5.7*  ALBUMIN 2.3*  AST 14  ALT 5  ALKPHOS 105  BILITOT 0.4    Studies/Results: US Abdomen Complete  04/01/2014   CLINICAL DATA:  Acute epigastric pain.  EXAM: ULTRASOUND ABDOMEN COMPLETE  COMPARISON:  None.  FINDINGS: Gallbladder: Multiple gallstones are noted with associated sludge. Severe gallbladder wall thickening is noted measuring 6 mm. No pericholecystic fluid is noted. No sonographic Murphy's sign is noted.  Common bile duct: Diameter: 3.4 mm which is within normal limits.  Liver: Heterogeneous echotexture of parenchyma is noted suggesting diffuse hepatocellular disease.  IVC: No abnormality visualized.  Pancreas: Visualized portion unremarkable.  Spleen: Not visualized due to overlying bowel gas.  Right Kidney: Length: 9.4 cm. 2.7 cm cyst seen in lower pole. Echogenicity within normal limits. No mass or hydronephrosis visualized.  Left Kidney:Not visualized due to overlying bowel gas.  Abdominal aorta: No aneurysm  visualized.  Other findings: None.  IMPRESSION: Heterogeneous echotexture hepatic parenchyma is noted consistent with diffuse hepatocellular disease.  Cholelithiasis is noted with associated sludge and significant gallbladder wall thickening. This is concerning for acute cholecystitis. HIDA scan may be performed for further evaluation. These results will be called to the ordering clinician or representative by the Radiologist Assistant, and communication documented in the PACS or zVision Dashboard.   Electronically Signed   By: Sabino Dick M.D.   On: 04/01/2014 15:53   Dg Ercp  04/02/2014   CLINICAL DATA:  Stent removal  EXAM: ERCP  TECHNIQUE: Multiple spot images obtained with the fluoroscopic device and submitted for interpretation post-procedure.  COMPARISON:  None.  FINDINGS: Images demonstrate removal of the temporary biliary stent. The common bile duct is dilated. Balloon stone removal is documented.  IMPRESSION: See above.  These images were submitted for radiologic interpretation only. Please see the procedural report for the amount of contrast and the fluoroscopy time utilized.   Electronically Signed   By: Maryclare Bean M.D.   On: 04/02/2014 12:35      Assessment / Plan:    80 y.o. female with a hx of chronic cholecystitis/cholelithiasis. She is s/p ERCP with sphincterotomy mid July for choledocholithiasis. A large stone was unable to be removed and biliary stent placed. Patient readmitted a few days ago with abdominal pain. Yesterday she underwent repeat ERCP with extraction of mid CBD stone and removal of previously placed biliary  stent. LFTs are normal. Surgery following, feels patient is too frail for cholecystectomy. HIDA ordered and if positive then getting perc drain    LOS: 2 days   Tye Savoy  04/03/2014, 11:41 AM      Attending physician's note   I have taken an interval history, reviewed the chart and examined the patient. I agree with the Advanced Practitioner's note,  impression and recommendations. Stable post ERCP stent removal and stone extraction. Surgery following and plans further evaluation with HIDA today. GI signing off.   Pricilla Riffle. Fuller Plan, MD [facility]

## 2014-04-03 NOTE — Progress Notes (Signed)
Patient ID: Tammy Willis, female   DOB: 01-24-1925, 80 y.o.   MRN: [id number] [facility] Surgery Progress Note:   1 Day Post-Op  Subjective: Mental status is alert.  Language barrier but she is feeling better and is not complaining of abdominal pain Objective: Vital signs in last 24 hours: Temp:  [97.5 F (36.4 C)-98.4 F (36.9 C)] 98.4 F (36.9 C) (10/10 0500) Pulse Rate:  [56-73] 63 (10/10 0500) Resp:  [14-21] 16 (10/10 0500) BP: (92-251)/(52-90) 143/67 mmHg (10/10 0500) SpO2:  [98 %-100 %] 99 % (10/10 0500)  Intake/Output from previous day: 10/09 0701 - 10/10 0700 In: 1100 [I.V.:1100] Out: -  Intake/Output this shift:    Physical Exam: Work of breathing is not labored.  No abdominal pain noted.   Lab Results:  Results for orders placed during the hospital encounter of 04/01/14 (from the past 48 hour(s))  GLUCOSE, CAPILLARY     Status: Abnormal   Collection Time    04/01/14 11:25 AM      Result Value Ref Range   Glucose-Capillary 184 (*) 70 - 99 mg/dL   Comment 1 Notify RN    URINALYSIS, ROUTINE W REFLEX MICROSCOPIC     Status: Abnormal   Collection Time    04/01/14 12:02 PM      Result Value Ref Range   Color, Urine YELLOW  YELLOW   APPearance CLOUDY (*) CLEAR   Specific Gravity, Urine 1.018  1.005 - 1.030   pH 5.0  5.0 - 8.0   Glucose, UA 100 (*) NEGATIVE mg/dL   Hgb urine dipstick NEGATIVE  NEGATIVE   Bilirubin Urine NEGATIVE  NEGATIVE   Ketones, ur NEGATIVE  NEGATIVE mg/dL   Protein, ur 30 (*) NEGATIVE mg/dL   Urobilinogen, UA 0.2  0.0 - 1.0 mg/dL   Nitrite NEGATIVE  NEGATIVE   Leukocytes, UA SMALL (*) NEGATIVE  URINE MICROSCOPIC-ADD ON     Status: Abnormal   Collection Time    04/01/14 12:02 PM      Result Value Ref Range   Squamous Epithelial / LPF FEW (*) RARE   WBC, UA 3-6  <3 WBC/hpf   RBC / HPF 0-2  <3 RBC/hpf   Bacteria, UA RARE  RARE   Casts GRANULAR CAST (*) NEGATIVE   Comment: HYALINE CASTS   Crystals CA OXALATE CRYSTALS (*)  NEGATIVE  GLUCOSE, CAPILLARY     Status: Abnormal   Collection Time    04/01/14  5:05 PM      Result Value Ref Range   Glucose-Capillary 54 (*) 70 - 99 mg/dL   Comment 1 Documented in Chart     Comment 2 Notify RN    GLUCOSE, CAPILLARY     Status: None   Collection Time    04/01/14  5:36 PM      Result Value Ref Range   Glucose-Capillary 95  70 - 99 mg/dL   Comment 1 Documented in Chart     Comment 2 Notify RN    GLUCOSE, CAPILLARY     Status: None   Collection Time    04/01/14  9:09 PM      Result Value Ref Range   Glucose-Capillary 84  70 - 99 mg/dL   Comment 1 Documented in Chart     Comment 2 Notify RN    GLUCOSE, CAPILLARY     Status: Abnormal   Collection Time    04/01/14 11:49 PM      Result Value Ref Range   Glucose-Capillary 122 (*)  70 - 99 mg/dL  CBC     Status: Abnormal   Collection Time    04/02/14  3:25 AM      Result Value Ref Range   WBC 4.3  4.0 - 10.5 K/uL   RBC 3.43 (*) 3.87 - 5.11 MIL/uL   Hemoglobin 9.9 (*) 12.0 - 15.0 g/dL   HCT 31.5 (*) 36.0 - 46.0 %   MCV 91.8  78.0 - 100.0 fL   MCH 28.9  26.0 - 34.0 pg   MCHC 31.4  30.0 - 36.0 g/dL   RDW 14.0  11.5 - 15.5 %   Platelets 179  150 - 400 K/uL  COMPREHENSIVE METABOLIC PANEL     Status: Abnormal   Collection Time    04/02/14  3:25 AM      Result Value Ref Range   Sodium 139  137 - 147 mEq/L   Potassium 4.2  3.7 - 5.3 mEq/L   Chloride 109  96 - 112 mEq/L   CO2 22  19 - 32 mEq/L   Glucose, Bld 109 (*) 70 - 99 mg/dL   BUN 16  6 - 23 mg/dL   Creatinine, Ser 1.30 (*) 0.50 - 1.10 mg/dL   Calcium 8.5  8.4 - 10.5 mg/dL   Total Protein 5.7 (*) 6.0 - 8.3 g/dL   Albumin 2.3 (*) 3.5 - 5.2 g/dL   AST 14  0 - 37 U/L   ALT 5  0 - 35 U/L   Alkaline Phosphatase 105  39 - 117 U/L   Total Bilirubin 0.4  0.3 - 1.2 mg/dL   GFR calc non Af Amer 36 (*) >90 mL/min   GFR calc Af Amer 42 (*) >90 mL/min   Comment: (NOTE)     The eGFR has been calculated using the CKD EPI equation.     This calculation has not been  validated in all clinical situations.     eGFR's persistently <90 mL/min signify possible Chronic Kidney     Disease.   Anion gap 8  5 - 15  GLUCOSE, CAPILLARY     Status: Abnormal   Collection Time    04/02/14  4:23 AM      Result Value Ref Range   Glucose-Capillary 116 (*) 70 - 99 mg/dL  GLUCOSE, CAPILLARY     Status: Abnormal   Collection Time    04/02/14  7:56 AM      Result Value Ref Range   Glucose-Capillary 118 (*) 70 - 99 mg/dL   Comment 1 Documented in Chart     Comment 2 Notify RN    GLUCOSE, CAPILLARY     Status: Abnormal   Collection Time    04/02/14  1:57 PM      Result Value Ref Range   Glucose-Capillary 181 (*) 70 - 99 mg/dL  GLUCOSE, CAPILLARY     Status: Abnormal   Collection Time    04/02/14  5:42 PM      Result Value Ref Range   Glucose-Capillary 217 (*) 70 - 99 mg/dL  GLUCOSE, CAPILLARY     Status: Abnormal   Collection Time    04/02/14  8:14 PM      Result Value Ref Range   Glucose-Capillary 137 (*) 70 - 99 mg/dL  GLUCOSE, CAPILLARY     Status: Abnormal   Collection Time    04/03/14 12:14 AM      Result Value Ref Range   Glucose-Capillary 126 (*) 70 - 99 mg/dL  GLUCOSE,  CAPILLARY     Status: None   Collection Time    04/03/14  5:30 AM      Result Value Ref Range   Glucose-Capillary 87  70 - 99 mg/dL  GLUCOSE, CAPILLARY     Status: None   Collection Time    04/03/14  8:12 AM      Result Value Ref Range   Glucose-Capillary 94  70 - 99 mg/dL    Radiology/Results: US Abdomen Complete  04/01/2014   CLINICAL DATA:  Acute epigastric pain.  EXAM: ULTRASOUND ABDOMEN COMPLETE  COMPARISON:  None.  FINDINGS: Gallbladder: Multiple gallstones are noted with associated sludge. Severe gallbladder wall thickening is noted measuring 6 mm. No pericholecystic fluid is noted. No sonographic Murphy's sign is noted.  Common bile duct: Diameter: 3.4 mm which is within normal limits.  Liver: Heterogeneous echotexture of parenchyma is noted suggesting diffuse  hepatocellular disease.  IVC: No abnormality visualized.  Pancreas: Visualized portion unremarkable.  Spleen: Not visualized due to overlying bowel gas.  Right Kidney: Length: 9.4 cm. 2.7 cm cyst seen in lower pole. Echogenicity within normal limits. No mass or hydronephrosis visualized.  Left Kidney:Not visualized due to overlying bowel gas.  Abdominal aorta: No aneurysm visualized.  Other findings: None.  IMPRESSION: Heterogeneous echotexture hepatic parenchyma is noted consistent with diffuse hepatocellular disease.  Cholelithiasis is noted with associated sludge and significant gallbladder wall thickening. This is concerning for acute cholecystitis. HIDA scan may be performed for further evaluation. These results will be called to the ordering clinician or representative by the Radiologist Assistant, and communication documented in the PACS or zVision Dashboard.   Electronically Signed   By: Sabino Dick M.D.   On: 04/01/2014 15:53   Dg Ercp  04/02/2014   CLINICAL DATA:  Stent removal  EXAM: ERCP  TECHNIQUE: Multiple spot images obtained with the fluoroscopic device and submitted for interpretation post-procedure.  COMPARISON:  None.  FINDINGS: Images demonstrate removal of the temporary biliary stent. The common bile duct is dilated. Balloon stone removal is documented.  IMPRESSION: See above.  These images were submitted for radiologic interpretation only. Please see the procedural report for the amount of contrast and the fluoroscopy time utilized.   Electronically Signed   By: Maryclare Bean M.D.   On: 04/02/2014 12:35    Anti-infectives: Anti-infectives   Start     Dose/Rate Route Frequency Ordered Stop   04/02/14 0415  ampicillin-sulbactam (UNASYN) 1.5 g in sodium chloride 0.9 % 50 mL IVPB     1.5 g 100 mL/hr over 30 Minutes Intravenous  Once 04/02/14 0401 04/02/14 1045      Assessment/Plan: Problem List: Patient Active Problem List   Diagnosis Date Noted  . Abdominal pain 04/01/2014  .  Vomiting 04/01/2014  . Abdominal pain, epigastric 04/01/2014  . CKD (chronic kidney disease), stage III 04/01/2014  . Abdominal pain, right upper quadrant 01/18/2014  . Protein-calorie malnutrition, severe 01/13/2014  . Choledocholithiasis with obstruction 01/12/2014  . Nausea with vomiting 01/12/2014  . Abnormal LFTs 01/11/2014  . Abdominal pain, other specified site 01/11/2014  . Dysphagia 01/11/2014  . Acute renal insufficiency 01/11/2014  . DM (diabetes mellitus), type 2, uncontrolled, with renal complications 40/03/2724  . Accelerated hypertension 08/25/2011    Pain may be better after ERCP.   1 Day Post-Op    LOS: 2 days   Matt B. Hassell Done, MD, [facility] [phone] beeper [phone]  04/03/2014 9:01 AM

## 2014-04-03 NOTE — Progress Notes (Addendum)
Communication with patient, via interpreter line. Denies complaints of discomfort at this time, offered liquids, reminded not to get oob without calling, voiced understanding of calling instructions. Updated regarding plan of care for today Tammy Willis

## 2014-04-05 ENCOUNTER — Encounter (HOSPITAL_COMMUNITY): Payer: Self-pay | Admitting: Gastroenterology

## 2014-06-06 ENCOUNTER — Emergency Department (HOSPITAL_COMMUNITY): Payer: No Typology Code available for payment source

## 2014-06-06 ENCOUNTER — Encounter (HOSPITAL_COMMUNITY): Payer: Self-pay | Admitting: Emergency Medicine

## 2014-06-06 ENCOUNTER — Emergency Department (HOSPITAL_COMMUNITY)
Admission: EM | Admit: 2014-06-06 | Discharge: 2014-06-06 | Disposition: A | Discharge status: Home / Self Care | Payer: No Typology Code available for payment source | Attending: Emergency Medicine | Admitting: Emergency Medicine

## 2014-06-06 DIAGNOSIS — E119 Type 2 diabetes mellitus without complications: Secondary | ICD-10-CM | POA: Insufficient documentation

## 2014-06-06 DIAGNOSIS — N189 Chronic kidney disease, unspecified: Secondary | ICD-10-CM | POA: Insufficient documentation

## 2014-06-06 DIAGNOSIS — Z8719 Personal history of other diseases of the digestive system: Secondary | ICD-10-CM | POA: Insufficient documentation

## 2014-06-06 DIAGNOSIS — Z79899 Other long term (current) drug therapy: Secondary | ICD-10-CM | POA: Insufficient documentation

## 2014-06-06 DIAGNOSIS — R079 Chest pain, unspecified: Secondary | ICD-10-CM

## 2014-06-06 DIAGNOSIS — I129 Hypertensive chronic kidney disease with stage 1 through stage 4 chronic kidney disease, or unspecified chronic kidney disease: Secondary | ICD-10-CM | POA: Insufficient documentation

## 2014-06-06 LAB — BASIC METABOLIC PANEL
Anion gap: 13 (ref 5–15)
BUN: 21 mg/dL (ref 6–23)
CO2: 25 mEq/L (ref 19–32)
Calcium: 9.7 mg/dL (ref 8.4–10.5)
Chloride: 97 mEq/L (ref 96–112)
Creatinine, Ser: 1.25 mg/dL — ABNORMAL HIGH (ref 0.50–1.10)
GFR calc Af Amer: 44 mL/min — ABNORMAL LOW (ref >90)
GFR calc non Af Amer: 38 mL/min — ABNORMAL LOW (ref >90)
Glucose, Bld: 257 mg/dL — ABNORMAL HIGH (ref 70–99)
Potassium: 4.5 mEq/L (ref 3.7–5.3)
Sodium: 135 mEq/L — ABNORMAL LOW (ref 137–147)

## 2014-06-06 LAB — CBC WITH DIFFERENTIAL/PLATELET
Basophils Absolute: 0 10*3/uL (ref 0.0–0.1)
Basophils Relative: 0 % (ref 0–1)
Eosinophils Absolute: 0 10*3/uL (ref 0.0–0.7)
Eosinophils Relative: 0 % (ref 0–5)
HCT: 38.2 % (ref 36.0–46.0)
Hemoglobin: 12.4 g/dL (ref 12.0–15.0)
Lymphocytes Relative: 3 % — ABNORMAL LOW (ref 12–46)
Lymphs Abs: 0.3 10*3/uL — ABNORMAL LOW (ref 0.7–4.0)
MCH: 28.6 pg (ref 26.0–34.0)
MCHC: 32.5 g/dL (ref 30.0–36.0)
MCV: 88.2 fL (ref 78.0–100.0)
Monocytes Absolute: 0.3 10*3/uL (ref 0.1–1.0)
Monocytes Relative: 3 % (ref 3–12)
Neutro Abs: 9.7 10*3/uL — ABNORMAL HIGH (ref 1.7–7.7)
Neutrophils Relative %: 94 % — ABNORMAL HIGH (ref 43–77)
Platelets: 232 10*3/uL (ref 150–400)
RBC: 4.33 MIL/uL (ref 3.87–5.11)
RDW: 14 % (ref 11.5–15.5)
WBC: 10.4 10*3/uL (ref 4.0–10.5)

## 2014-06-06 LAB — CBG MONITORING, ED: Glucose-Capillary: 239 mg/dL — ABNORMAL HIGH (ref 70–99)

## 2014-06-06 LAB — TROPONIN I
Troponin I: <0.3 ng/mL (ref <0.30)
Troponin I: <0.3 ng/mL (ref <0.30)

## 2014-06-06 MED ORDER — LABETALOL HCL 5 MG/ML IV SOLN
10.0000 mg | Freq: Once | INTRAVENOUS | Status: AC
Start: 2014-06-06 — End: 2014-06-06
  Administered 2014-06-06: 10 mg via INTRAVENOUS
  Filled 2014-06-06: qty 4

## 2014-06-06 MED ORDER — MORPHINE SULFATE 2 MG/ML IJ SOLN
2.0000 mg | Freq: Once | INTRAMUSCULAR | Status: DC
  Filled 2014-06-06: qty 1

## 2014-06-06 MED ORDER — HYDRALAZINE HCL 50 MG PO TABS
50.0000 mg | ORAL_TABLET | Freq: Once | ORAL | Status: AC
  Administered 2014-06-06: 50 mg via ORAL
  Filled 2014-06-06: qty 1

## 2014-06-06 MED ORDER — ONDANSETRON HCL 4 MG/2ML IJ SOLN
4.0000 mg | Freq: Once | INTRAMUSCULAR | Status: AC
  Administered 2014-06-06: 4 mg via INTRAVENOUS
  Filled 2014-06-06: qty 2

## 2014-06-06 MED ORDER — ASPIRIN 81 MG PO CHEW: 324.0000 mg | CHEWABLE_TABLET | Freq: Once | ORAL | Status: DC

## 2014-06-06 NOTE — ED Notes (Signed)
Patient transported to X-ray 

## 2014-06-06 NOTE — ED Notes (Signed)
Patient returned from X-ray 

## 2014-06-06 NOTE — ED Notes (Signed)
Wilson Singer, MD using translator services for assessment at this time.

## 2014-06-06 NOTE — ED Notes (Signed)
Per EMS, pt c/o to family that her neck and chest was hurting. Pt A&OX4, NAD noted. Pt was walking to kitchen to eat breakfast and could not eat because of chest and neck pain. BP 182/100, P70, R16, 99%rm air. 324mg  ASA given PO.

## 2014-06-06 NOTE — ED Provider Notes (Signed)
CSN: [id number]     Arrival date & time 06/06/14  1308 History   First MD Initiated Contact with Patient 06/06/14 1313     Chief Complaint  Patient presents with  . Chest Pain     (Consider location/radiation/quality/duration/timing/severity/associated sxs/prior Treatment) HPI   89yF with CP. Hx somewhat difficult even with interpretor because son keeps trying to answer questions for patient. Presenting with CP. Onset shortly before arrival. Sitting down eating with family when acute onset CP with radiation into R neck. She actually fell from the chair. Did not lose consciousness. Had to be assisted up by family and they said she had no energy. Pain lasted about 15 minutes. Currently resolved and no further complaints. Felt fine earlier in day. Did not feel like food was sticking/catching. No sore throat. No SOB. No fever or chills. No n/v.   Past Medical History  Diagnosis Date  . Hypertension   . Diabetes mellitus   . Choledocholithiasis with obstruction 01/12/2014  . Hiatal hernia   . Common bile duct stone 12/2013  . CKD (chronic kidney disease) 12/2013.    Past Surgical History  Procedure Laterality Date  . Kidney stone surgery    . Cataract extraction Bilateral   . Appendectomy    . Esophagogastroduodenoscopy N/A 01/13/2014    Procedure: ESOPHAGOGASTRODUODENOSCOPY (EGD);  Surgeon: Ladene Artist, MD;  [facility] END[facility]COPY;  Service: Endoscopy;  Laterality: N/A;  . Ercp N/A 01/18/2014    Procedure: ENDOSCOPIC RETROGRADE CHOLANGIOPANCREATOGRAPHY (ERCP);  Surgeon: Ladene Artist, MD;  Location: [facility] ENDOSCOPY;  Service: Endoscopy;  Laterality: N/A;  . Ercp N/A 04/02/2014    Procedure: ENDOSCOPIC RETROGRADE CHOLANGIOPANCREATOGRAPHY (ERCP);  Surgeon: Ladene Artist, MD;  Location[facility] ENDOSCOPY;  Service: Endoscopy;  Laterality: N/A;  . Lithotripsy N/A 04/02/2014    Procedure: LITHOTRIPSY;  Surgeon: Ladene Artist, MD;  Location: [facility] ENDOSCOPY;  Service: Endoscopy;  Laterality: N/A;      . Gastrointestinal stent removal N/A 04/02/2014    Procedure: GASTROINTESTINAL STENT REMOVAL;  Surgeon: Ladene Artist, MD;  Location: [facility] ENDOSCOPY;  Service: Endoscopy;  Laterality: N/A;   History reviewed. No pertinent family history. History  Substance Use Topics  . Smoking status: Never Smoker   . Smokeless tobacco: Never Used  . Alcohol Use: No   OB History    No data available     Review of Systems  All systems reviewed and negative, other than as noted in HPI.   Allergies  Review of patient's allergies indicates no known allergies.  Home Medications   Prior to Admission medications   Medication Sig Start Date End Date Taking? Authorizing Provider  hydrALAZINE (APRESOLINE) 50 MG tablet Take 1 tablet (50 mg total) by mouth 3 (three) times daily. 04/03/14   Barton Dubois, MD  labetalol (NORMODYNE) 200 MG tablet Take 1 tablet (200 mg total) by mouth 2 (two) times daily. 04/03/14   Barton Dubois, MD  pantoprazole (PROTONIX) 40 MG tablet Take 1 tablet (40 mg total) by mouth daily at 12 noon. 04/03/14   Barton Dubois, MD  polyethylene glycol [facility] / GLYCOLAX) packet Take 17 g by mouth daily as needed.    Historical Provider, MD   BP 233/80 mmHg  Pulse 78  Temp(Src) 98.9 F (37.2 C) (Oral)  Resp 17  SpO2 100% Physical Exam  Constitutional: She appears well-developed and well-nourished. No distress.  HENT:  Head: Normocephalic and atraumatic.  Eyes: Conjunctivae and EOM are normal. Pupils are equal, round, and reactive to light.  Right eye exhibits no discharge. Left eye exhibits no discharge.  Neck: Neck supple.  Cardiovascular: Normal rate, regular rhythm and normal heart sounds.  Exam reveals no gallop and no friction rub.   No murmur heard. Pulmonary/Chest: Effort normal and breath sounds normal. No respiratory distress.  Abdominal: Soft. She exhibits no distension. There is no tenderness.  Musculoskeletal: She exhibits no edema or tenderness.  Lower  extremities symmetric as compared to each other. No calf tenderness. Negative Homan's. No palpable cords.   Neurological: She is alert.  Skin: Skin is warm and dry. She is not diaphoretic.  Psychiatric: She has a normal mood and affect. Her behavior is normal. Thought content normal.  Nursing note and vitals reviewed.   ED Course  Procedures (including critical care time) Labs Review Labs Reviewed  CBG MONITORING, ED - Abnormal; Notable for the following:    Glucose-Capillary 239 (*)    All other components within normal limits  CBC WITH DIFFERENTIAL  BASIC METABOLIC PANEL  TROPONIN I    Imaging Review Dg Chest 2 View  06/06/2014   CLINICAL DATA:  Reason for exam is for chest pain. Hx of Diabetes and HTN. Patient was unable to give history. RN notes below: Per EMS, pt c/o to family that her neck and chest was hurting. Pt alert and oriented x4, NAD noted. Pt was walking to kitchen to eat breakfast and could not eat because of chest and neck pain.  EXAM: CHEST  2 VIEW  COMPARISON:  03/29/2014  FINDINGS: Lungs are mildly hyperinflated. The heart is prominent in size. There are no focal consolidations or pleural effusions. No pulmonary edema. Chronic wedge compression fracture is identified at T12. No acute displaced fractures are identified.  IMPRESSION: 1. Cardiomegaly. 2.  No focal acute pulmonary abnormality. 3. Stable chronic wedge compression fracture at T12.   Electronically Signed   By: Shon Hale M.D.   On: 06/06/2014 15:05     EKG Interpretation None      MDM   Final diagnoses:  Chest pain        Virgel Manifold, MD 06/07/14 [phone]

## 2014-06-06 NOTE — Discharge Instructions (Signed)
Dolor de pecho (no especfico) (Chest Pain (Nonspecific)) Con frecuencia es difcil dar un diagnstico especfico de la causa del dolor de [place]. Siempre hay una posibilidad de que el dolor podra estar relacionado con algo grave, como un ataque al corazn o un cogulo sanguneo en los pulmones. Debe someterse a controles con el mdico para ms evaluaciones. CAUSAS   Acidez.  Neumona o bronquitis.  Ansiedad o estrs.  Inflamacin de la zona que rodea al corazn (pericarditis) o a los pulmones (pleuritis o pleuresa).  Un cogulo sanguneo en el pulmn.  Colapso de un pulmn (neumotrax), que puede aparecer de [employer] repentina por s solo (neumotrax espontneo) o debido a un traumatismo en el trax.  Culebrilla (virus del herpes zster). La pared torcica est compuesta por huesos, msculos y Database administrator. Cualquiera de estos puede ser la fuente del dolor.  Puede haber una contusin en los huesos debido a una lesin.  Puede haber un esguince en los msculos o el cartlago ocasionado por la tos o por [place].  El cartlago puede verse afectado por una inflamacin y Engineer, production (costocondritis). DIAGNSTICO  Ileene Hutchinson se necesiten anlisis de laboratorio u otros estudios para Animator causa del Social research officer, government. Adems, puede indicarle que se haga una prueba llamada electrocadiograma (ECG) ambulatorio. El ECG registra los patrones de los latidos cardacos durante 24horas. Adems, pueden hacerle otros estudios, por ejemplo:  Ecocardiograma transtorcico (ETT). Durante IT trainer, se usan ondas sonoras para evaluar el flujo de la sangre a travs del corazn.  Ecocardiograma transesofgico (ETE).  Monitoreo cardaco. Permite que el mdico controle la frecuencia y el ritmo cardaco en tiempo real.  Monitor Holter. Es un dispositivo porttil que [employer] latidos cardacos y [place] a Retail buyer las arritmias cardacas. Le permite al [employer] registrar la actividad [place], si es necesario.  Pruebas de estrs por ejercicio o por medicamentos que aceleran los latidos cardacos. TRATAMIENTO   El tratamiento depende de la causa del dolor de [place]. El tratamiento puede incluir:  Inhibidores de la acidez estomacal.  Antiinflamatorios.  Analgsicos para las enfermedades inflamatorias.  Antibiticos, si hay una infeccin.  Podrn aconsejarle que modifique su estilo de vida. Esto incluye dejar de fumar y evitar el alcohol, la cafena y el chocolate.  Pueden aconsejarle que mantenga la cabeza levantada (elevada) cuando duerme. Esto reduce la probabilidad de que el cido retroceda del estmago al esfago. En la [employer], el dolor de pecho no especfico mejorar en el trmino de 2 a 3das, con reposo y [employer].  INSTRUCCIONES PARA EL CUIDADO EN EL HOGAR   Si le prescriben antibiticos, tmelos tal como se le indic. Termnelos aunque comience a sentirse mejor.  [address] no haga actividades fsicas que provoquen dolor de [place]. Contine con las actividades fsicas tal como se le indic  No consuma ningn producto que contenga tabaco, incluidos cigarrillos, tabaco de Higher education careers adviser o cigarrillos electrnicos.  Evite el consumo de alcohol.  Tome los medicamentos solamente como se lo haya indicado el mdico.  Siga las sugerencias del mdico en lo que respecta a las pruebas adicionales, si el dolor de pecho no desaparece.  Concurra a todas las visitas de control programadas. Si no lo hace, podra desarrollar problemas permanentes (crnicos) relacionados con el dolor. Si hay algn problema para concurrir a una cita, llame para reprogramarla. SOLICITE ATENCIN MDICA SI:   El dolor de pecho no desaparece, incluso despus del tratamiento.  Tiene una erupcin cutnea con ampollas en el  pecho.  Jaclynn Guarneri. SOLICITE ATENCIN MDICA DE [employer] SI:   Aumenta el dolor de pecho o este se irradia hacia el  brazo, el cuello, la [place], la espalda o el abdomen.  Le falta el aire.  La tos empeora, o expectora sangre.  Siente dolor intenso en la espalda o el abdomen.  Se siente nauseoso o vomita.  Siente debilidad intensa.  Se desmaya.  Tiene escalofros. Esto es Engineer, maintenance (IT). No espere a ver si el dolor se pasa. Obtenga ayuda mdica de inmediato. Llame a los servicios de emergencia locales (911 en [place]). No conduzca por sus propios medios [employer] hospital. ASEGRESE DE QUE:   Comprende estas instrucciones.  Controlar su afeccin.  Recibir ayuda de inmediato si no mejora o si empeora. Document Released: 06/11/2005 Document Revised: 06/16/2013 [facility] Patient Information 2015 [place]. This information is not intended to replace advice given to you by your health care provider. Make sure you discuss any questions you have with your health care provider.

## 2014-09-24 ENCOUNTER — Ambulatory Visit: Payer: No Typology Code available for payment source

## 2016-05-06 IMAGING — MR MR MRCP
6 of 8 series · 23 of 48 positions shown · non-contrast
Comparison: Ultrasound 01/11/2014, HIDA scan 01/13/2014, CT
01/09/2011

CLINICAL DATA: Ultrasound 01/11/2014, HIDA scan 01/13/2014

EXAM:
MRI ABDOMEN WITHOUT  (INCLUDING MRCP)
TECHNIQUE: Multiplanar multisequence MR imaging of the abdomen was performed.
Heavily T2-weighted images of the biliary and pancreatic ducts were
obtained, and three-dimensional MRCP images were rendered by post
processing.

[Series 7: T2 · axial · 5.0mm · 0.70mm/px · z∈[-38,+177]mm · 5 of 44 slices shown (1 of 2)]
[im 1/44]
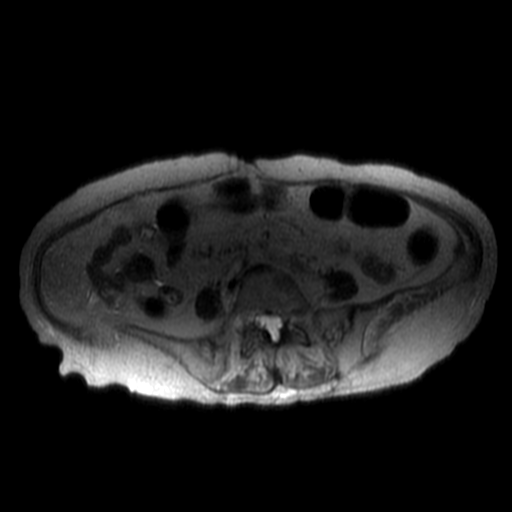
[im 11/44]
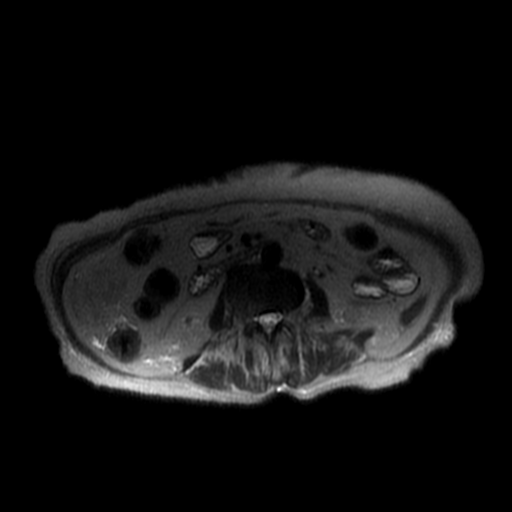
[im 22/44]
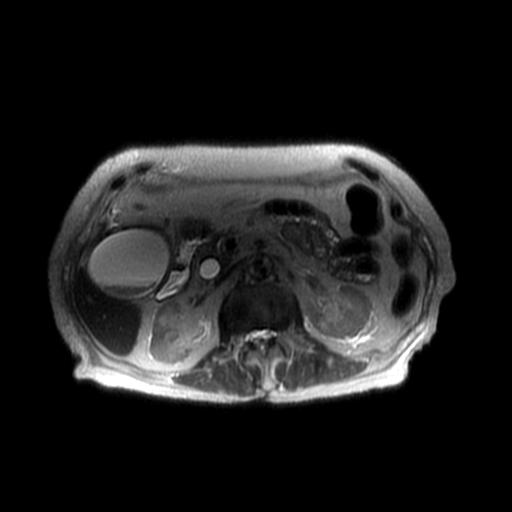
[im 33/44]
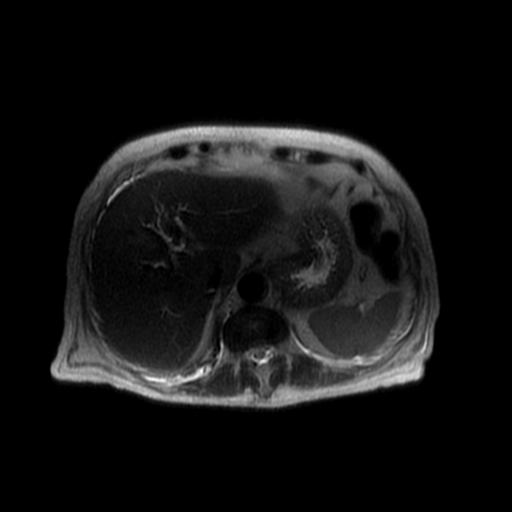
[im 44/44]
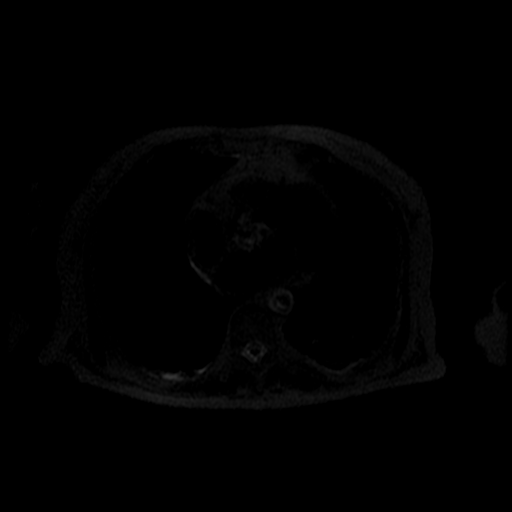

[Series 8: T2 · coronal · 5.0mm · 0.76mm/px · 3 of 29 slices shown (2 of 2)]
[im 1/29]
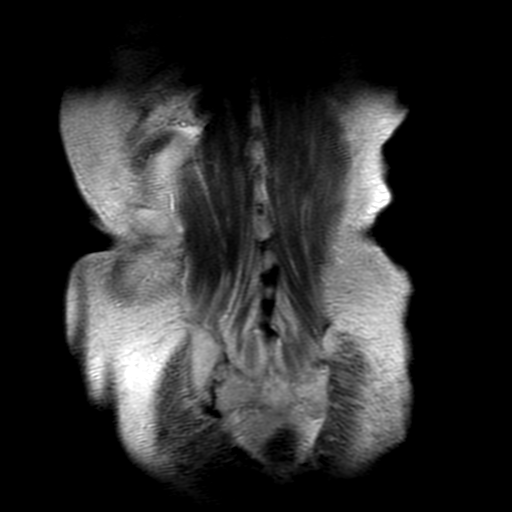
[im 15/29]
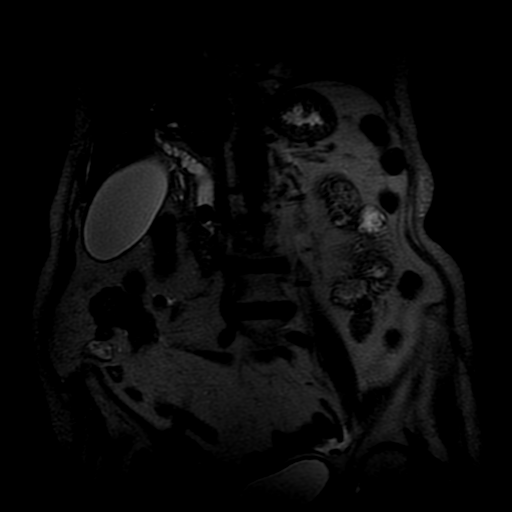
[im 29/29]
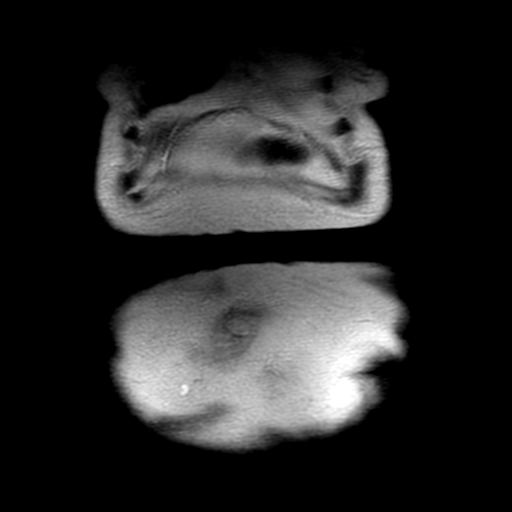

[Series 9: T2 fat-sat · axial · 5.0mm · 0.70mm/px · z∈[-9,+166]mm · 4 of 36 slices shown]
[im 1/36]
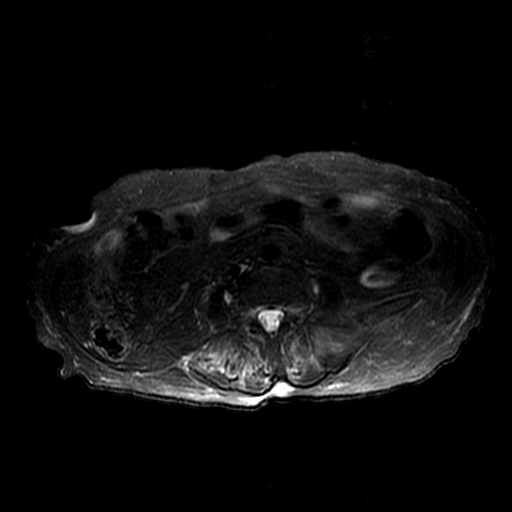
[im 12/36]
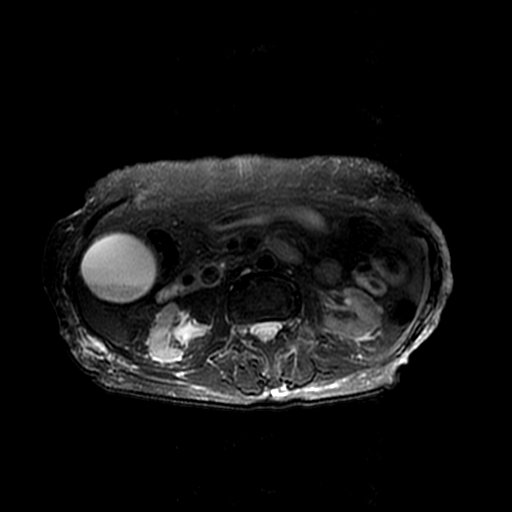
[im 24/36]
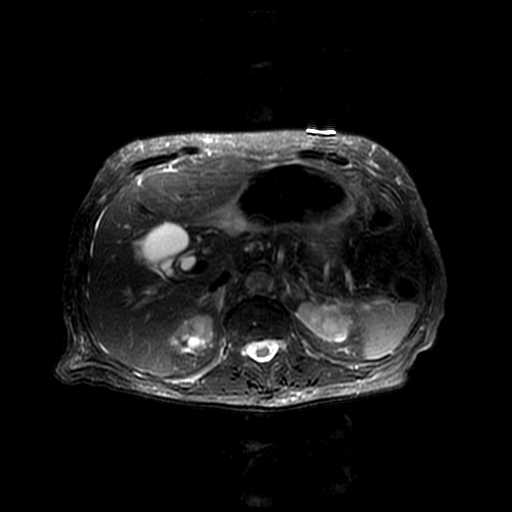
[im 36/36]
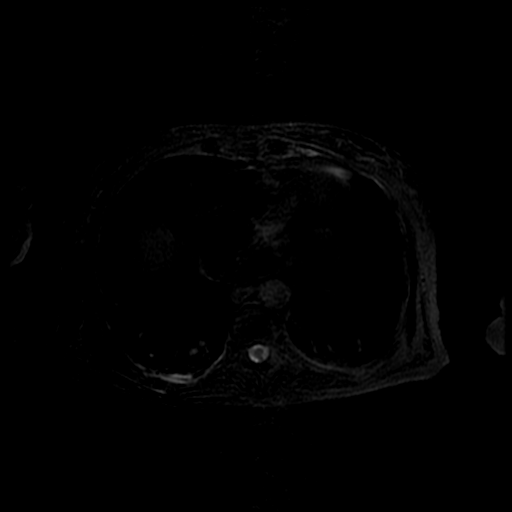

[Series 12: DWI b500 · axial · 6.0mm · 1.48mm/px · z∈[-37,+189]mm · 6 of 60 slices shown]
[im 1/60]
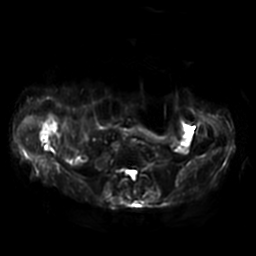
[im 12/60]
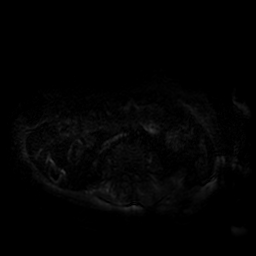
[im 24/60]
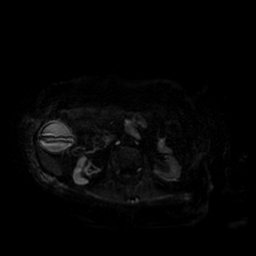
[im 36/60]
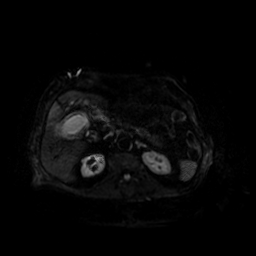
[im 48/60]
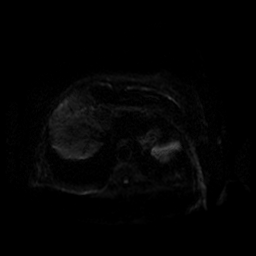
[im 60/60]
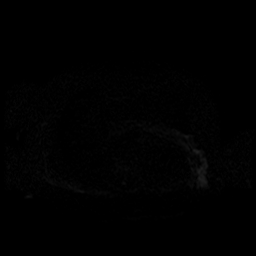

[Series 13: MRCP · coronal · 2.0mm · 0.70mm/px · 4 of 36 slices shown]
[im 1/36]
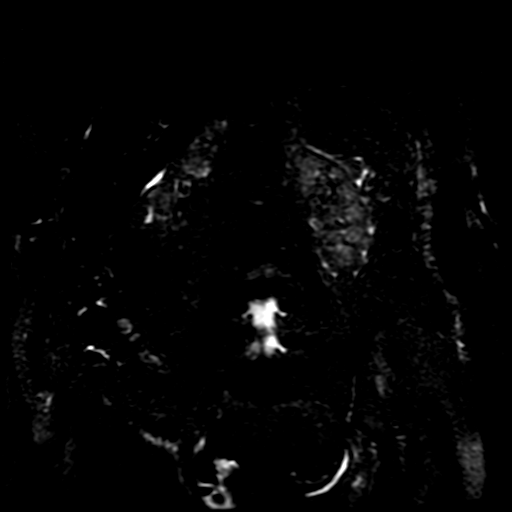
[im 12/36]
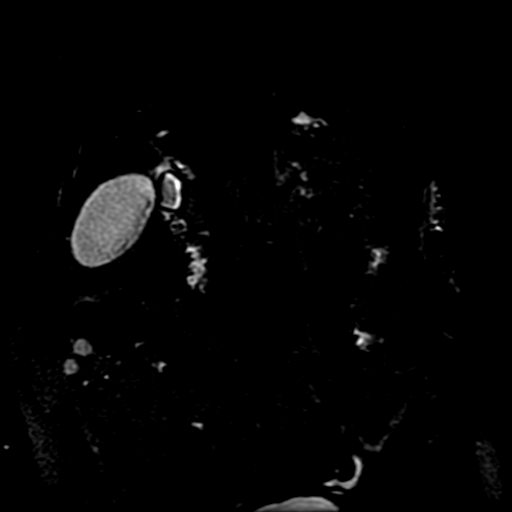
[im 24/36]
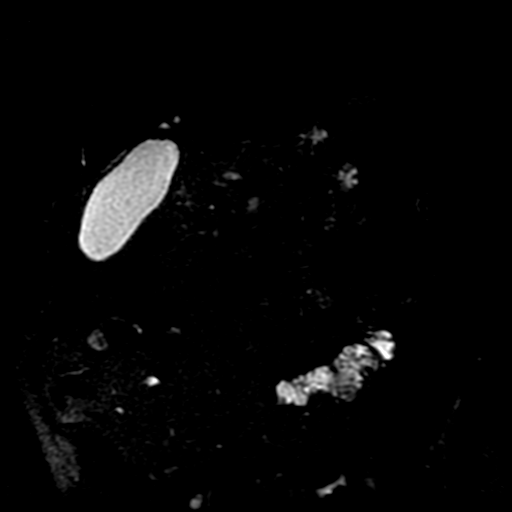
[im 36/36]
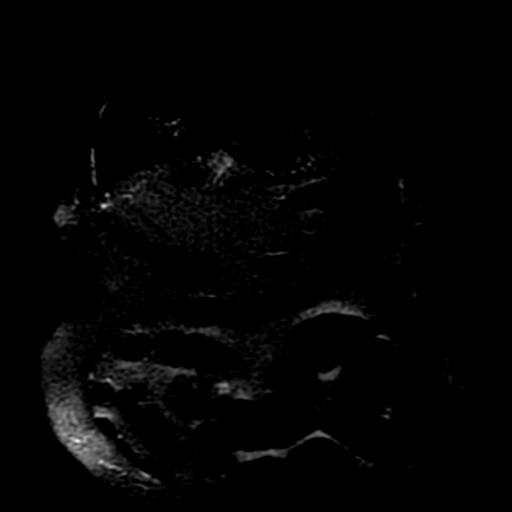

[Series 20: T1 dynamic · axial · 5.0mm · 0.78mm/px · 1 of 88 slices shown]
[im 1/88]
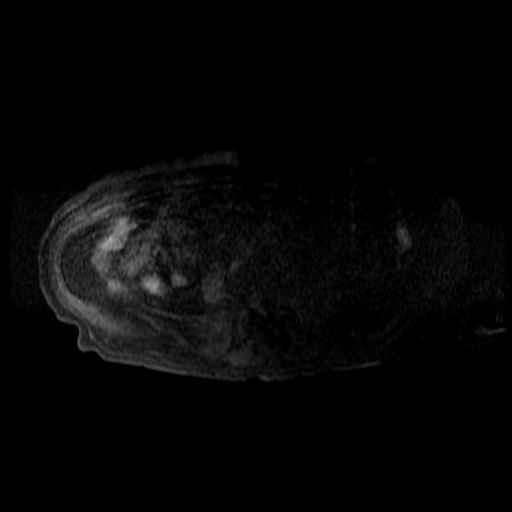

[23 of 48 positions shown; findings below may reference images not displayed]

FINDINGS: There is mild intrahepatic biliary duct dilatation. There is
significant dilatation of common hepatic duct and common bile duct.
The common bile duct measures 14 mm. There is a large calculus
within the distal common bile duct measuring 14 mm seen on coronal
series 8, image 15. There is no significant pancreatic duct
dilatation. The gallbladder is distended to 5.4 cm. There is
echogenic sludge versus small stones layering within the
gallbladder. No significant pericholecystic fluid.

There is no focal hepatic. The pancreatic parenchyma is normal. The
spleen adrenal glands normal. There is a cysts within the cortex of
left and right kidneys which likely benign. No periportal
retroperitoneal adenopathy. No pleural fluid.
IMPRESSION: 1. Large calculus within the distal common bile duct with evidence
of biliary obstruction with moderate extrahepatic biliary duct
dilatation and mild intrahepatic biliary duct dilatation.
2. The gallbladder is distended with sludge which is also likely
related to the obstruction. Evidence of chronic cholecystitis on
HIDA scan
3. Biliary obstruction is partial as demonstrated on HIDA of
01/13/2014.
